# Patient Record
Sex: Female | Born: 1965 | Race: White | Hispanic: No | Marital: Single | Smoking: Never smoker
Health system: Southern US, Community
[De-identification: ages and names within clinical notes are randomized; demographics above are authoritative.]

## PROBLEM LIST (undated history)

## (undated) DIAGNOSIS — K219 Gastro-esophageal reflux disease without esophagitis: Secondary | ICD-10-CM

## (undated) DIAGNOSIS — D649 Anemia, unspecified: Secondary | ICD-10-CM

## (undated) DIAGNOSIS — E559 Vitamin D deficiency, unspecified: Secondary | ICD-10-CM

## (undated) DIAGNOSIS — I493 Ventricular premature depolarization: Secondary | ICD-10-CM

## (undated) DIAGNOSIS — G4730 Sleep apnea, unspecified: Secondary | ICD-10-CM

## (undated) HISTORY — DX: Anemia, unspecified: D64.9

## (undated) HISTORY — DX: Gastro-esophageal reflux disease without esophagitis: K21.9

## (undated) HISTORY — DX: Ventricular premature depolarization: I49.3

## (undated) HISTORY — PX: HERNIA REPAIR: SHX51

## (undated) HISTORY — DX: Vitamin D deficiency, unspecified: E55.9

## (undated) HISTORY — DX: Sleep apnea, unspecified: G47.30

## (undated) HISTORY — PX: GASTRIC BYPASS: SHX52

## (undated) MED FILL — Iron Sucrose Inj 20 MG/ML (Fe Equiv): INTRAVENOUS | Qty: 10 | Status: AC

---

## 2012-01-02 DIAGNOSIS — K219 Gastro-esophageal reflux disease without esophagitis: Secondary | ICD-10-CM | POA: Insufficient documentation

## 2012-08-02 DIAGNOSIS — G4730 Sleep apnea, unspecified: Secondary | ICD-10-CM | POA: Insufficient documentation

## 2014-10-11 DIAGNOSIS — K432 Incisional hernia without obstruction or gangrene: Secondary | ICD-10-CM | POA: Insufficient documentation

## 2015-07-13 DIAGNOSIS — I493 Ventricular premature depolarization: Secondary | ICD-10-CM | POA: Insufficient documentation

## 2017-11-20 DIAGNOSIS — Z86010 Personal history of colonic polyps: Secondary | ICD-10-CM | POA: Insufficient documentation

## 2017-11-20 DIAGNOSIS — R011 Cardiac murmur, unspecified: Secondary | ICD-10-CM | POA: Insufficient documentation

## 2019-03-24 NOTE — Progress Notes (Signed)
 Chief Complaint  Patient presents with  . Annual Exam    Subjective:  Tamara Kelly is a 53 y.o. female here for a health maintenance visit.  Patient is an established pt  Patient Active Problem List  Diagnosis  . Anemia  . H/O gastric bypass  . GERD (gastroesophageal reflux disease)  . Fatigue  . Vitamin D  insufficiency  . Colon polyps  . Sleep apnea  . Back pain  . Knee pain  . Incisional hernia  . Laxity of skin  . Frequent PVCs    Past Medical History:  Diagnosis Date  . Acid reflux   . Anemia   . Back pain 08/02/2012  . Colon polyps 07/05/2012   Dx: 2008 [place]  . Dyspnea 08/02/2012  . GERD (gastroesophageal reflux disease) 01/02/2012  . H/O gastric bypass 01/02/2012   2001 [place]  Pre surgery weight 440  . History of motion sickness   . Knee pain 08/02/2012  . Morbid obesity (CMS-HCC)   . Obesity 01/03/2012  . PONV (postoperative nausea and vomiting)   . Sleep apnea 08/02/2012   prior to bypass  . Ulcer   . Vitamin D  insufficiency 03/23/2012    Past Surgical History:  Procedure Laterality Date  . ADENOIDECTOMY    . COLONOSCOPY N/A 07/05/2015   Procedure: COLORECTAL CANCER SCREENING; COLONOSCOPY ON INDIVIDUAL NOT MEETING CRITERIA FOR HIGH RISK;  Surgeon: Ranjan Iraq, MD;  Location: [facility];  Service: General Surgery;  Laterality: N/A;  . DILATION AND CURETTAGE, DIAGNOSTIC / THERAPEUTIC    . distal gastric bypass  06/17/2013  . ESOPHAGOGASTRODOUDENOSCOPY W/BIOPSY  11/18/2012   Procedure: ZDNEYJHNHJDUMNINLIZWNDRNEB W/BIOPSY;  Surgeon: Ranjan Iraq, MD;  Location: [facility];  Service: General Surgery;;  . GASTRIC BYPASS    . GASTRIC BYPASS OPEN  2001  . HERNIA REPAIR    . REPAIR HIATAL HERNIA    . REPAIR INGUINAL HERNIA Left 02/10/2019   Procedure: Open left inguinal hernia repair;  Surgeon: Kattie Lonni Carwin, MD;  Location: [facility] OR;  Service: General Surgery;  Laterality: Left;  . TONSILLECTOMY  1972    Outpatient Medications  Prior to Visit  Medication Sig Dispense Refill  . ferrous fumarate-vitamin C (VITRON-C) 65 mg iron - 125 mg tablet Take 1 tablet by mouth 2-3 times daily      . multivitamin tablet Take 1 tablet by mouth once daily. Reported on 05/31/2015      No facility-administered medications prior to visit.     No Known Allergies  Family History  Problem Relation Age of Onset  . Breast cancer Mother 33  . High blood pressure (Hypertension) Father   . Skin cancer Father   . Aneurysm Brother   . Diabetes type II Maternal Grandfather   . Glaucoma Maternal Grandfather   . Colon cancer Neg Hx   . Ovarian cancer Neg Hx   . Uterine cancer Neg Hx     Health Habits: Dental Exam: not up to date Eye Exam: up to date Exercise: 0 times/week on average Current exercise activities: none Diet: well balanced Social History   Socioeconomic History  . Marital status: Single    Spouse name: Not on file  . Number of children: Not on file  . Years of education: Not on file  . Highest education level: Not on file  Occupational History  . Not on file  Social Needs  . Financial resource strain: Not on file  . Food insecurity    Worry: Not on file  Inability: Not on file  . Transportation needs    Medical: Not on file    Non-medical: Not on file  Tobacco Use  . Smoking status: Former Smoker    Years: 0.50    Quit date: 04/21/1988    Years since quitting: 30.9  . Smokeless tobacco: Never Used  . Tobacco comment: previous social smoker  Substance and Sexual Activity  . Alcohol use: No  . Drug use: No  . Sexual activity: Yes    Partners: Male    Birth control/protection: None  Lifestyle  . Physical activity    Days per week: Not on file    Minutes per session: Not on file  . Stress: Not on file  Relationships  . Social Musician on phone: Not on file    Gets together: Not on file    Attends religious service: Not on file    Active member of club or organization: Not on file     Attends meetings of clubs or organizations: Not on file    Relationship status: Not on file  Other Topics Concern  . Not on file  Social History Narrative  . Not on file   Social History   Substance and Sexual Activity  Alcohol Use No   Social History   Tobacco Use  Smoking Status Former Smoker  . Years: 0.50  . Quit date: 04/21/1988  . Years since quitting: 30.9  Smokeless Tobacco Never Used  Tobacco Comment   previous social smoker   Social History   Substance and Sexual Activity  Drug Use No     Health Maintenance: See under health Maintenance activity for review of completion dates as well. Immunization History  Administered Date(s) Administered  . Influenza IIV3, IM pres-free 07/14/2013  . Influenza IIV4, IM Pres-free (3 Yr+) 02/09/2019  . Influenza IIV4, IM preservative 66Yr+ 02/09/2018  . Influenza, IM unspecified 02/03/2015, 01/19/2017, 06/19/2018  . Pneumococcal Polysaccharide 23-Valent (Pneumovax-23) 02/09/2018  . Tdap 01/02/2012  . Varicella zoster (Shingrix) 02/09/2018, 04/09/2018    Health Maintenance Topics with due status: Due On     Topic Date Due   Mammogram 01/23/2019   Health Maintenance Topics with due status: Not Due     Topic Last Completion Date   Adult Tetanus (Td And Tdap) 01/02/2012   Pap Smear 12/12/2016   Colorectal Cancer Screening 11/22/2017   Annual Visit/Physical/Well Child Check 03/24/2019   Health Maintenance Topics with due status: Completed     Topic Last Completion Date   HIV Screen 12/12/2016   Shingrix 04/09/2018   Hepatitis C Screen 01/17/2019   Influenza Vaccine 02/09/2019   Health Maintenance Topics with due status: Aged Out     Topic Date Due   HPV Vaccines Aged Out    Depression Screen-PHQ2/9 PHQ-2 PHQ-2 Over the last 2 weeks, how often have you been bothered by any of the following problems? Over the last 2 weeks, have you experienced little pleasure or interest in doing things?: Not At All Feeling  down, depressed, or hopeless (or irritable for Teens only)?: Several Days Total Prescreening Score: 1  PHQ-9 (if PHQ >=3) PHQ-9 Over the last 2 weeks, how often have you been bothered by any of the following problems? Total Score =: 1  Depression Severity and Treatment Recommendations:  0-4= None  5-9= Mild / Treatment: Support, educate to call if worse; return in one month  10-14= Moderate / Treatment: Support, watchful waiting; Antidepressant or Psycotherapy  15-19= Moderately severe /  Treatment: Antidepressant OR Psychotherapy  >= 20 = Major depression, severe / Antidepressant AND Psychotherapy    Review of Systems   Review of Systems  Constitutional: Negative for fever.  HENT: Negative for hearing loss.   Eyes: Negative for blurred vision.  Respiratory: Negative for shortness of breath.   Cardiovascular: Negative for chest pain.  Gastrointestinal: Negative for constipation.  Genitourinary: Negative for hematuria.  Musculoskeletal: Positive for joint pain.       Shoulder pain intermittently  Neurological: Positive for headaches.       Chronic  Psychiatric/Behavioral: Positive for depression. The patient has insomnia.     See HPI for ROS as well.    Objective:    Vitals:   03/24/19 1004  BP: 112/76  Pulse: 96  Resp: 18  Weight: 86.2 kg (190 lb 0.6 oz)  Height: 162.6 cm (5' 4)  PainSc:   3  PainLoc: Shoulder   Body mass index is 32.62 kg/m.  Physical Exam  Constitutional: alert, cooperative and in NAD Conversation: normal and appropriate HEENT: EOMI, external ear canals normal, tympanic membranes normal and pupils equal and round Skin: normal Oropharynx: good dentition, mucous membranes moist and posterior pharynx clear Neck: supple and no thyroid enlargement or cervical adenopathy Breast: normal appearance, no masses or tenderness, No nipple retraction or dimpling, No axillary or supraclavicular adenopathy Respiratory: clear to auscultation, without  rales or wheezes Cardiovascular: regular rate and rhythm and without murmurs, rubs or gallops Abdomen: soft, nontender, nondistended and no hepatosplenomegaly Musculoskeletal: age appropriate ROM of shoulders, hips and knees; normal rom of neck. Normal rom of shoulders. No point tenderness of c spine or shoulder joints.  Extremities: no lower extremity edema, dorsalis pedis pulses normal Neurological: grossly intact and normal gait Genitalia: Exam deferred.   Assessment/Plan:   Patient was seen for a health maintenance exam.  Counseled the patient on health maintenance issues. Reviewed her health mainteance schedule and ordered appropriate tests (see orders.) Counseled on regular exercise and weight management. Recommend regular eye exams and dental cleaning.   The following issues were addressed today for health maintenance: Encouraged continued vitamin D  intake at present level Encouraged to schedule routine dental follow up Encouraged to schedule routine eye check Recommended sunscreen usage -  Follow up for annual exam in one year  Diagnoses and all orders for this visit:  Routine general medical examination at health care facility Patient was counselled on SBE monthly, Calcium and vitamin D  for bone health, Sunblock use, Regular aerobic exercise, a balanced healthy diet and Routine dental care. Health Maintenance  Topic Date Due  . Mammogram  01/23/2019  . Annual Visit/Physical/Well Child Check  03/23/2020  . Pap Smear  12/12/2021  . Adult Tetanus (Td And Tdap)  01/01/2022  . Colorectal Cancer Screening  11/23/2027  . Shingrix  Completed  . HIV Screen  Completed  . Hepatitis C Screen  Completed  . Influenza Vaccine  Completed  . HPV Vaccines  Aged Out    H/O gastric bypass -     Vitamin B12 -     Folate -     Zinc  Vitamin D  insufficiency -     25 [place] Vitamin D  History of anemia -     Complete Blood Count (CBC) with Differential -     Ferritin  Cervical  radiculopathy -     Ambulatory Referral to Physical Therapy -     gabapentin (NEURONTIN) 300 MG capsule; Take 1 capsule (300 mg total) by mouth nightly  Polyp of colon, unspecified part of colon, unspecified type -     Ambulatory Referral to Colonoscopy-due to poor prep for cscope done 2019   Return in about 1 year (around 03/23/2020).    Body mass index is 32.62 kg/m.:  Discussed the patient's BMI with patient. The BMI is above the acceptable range; discussed or provided materials on diet/exercise   Future Appointments    Date/Time Provider Department Center Visit Type   04/21/2019 12:40 PM [facility] S[facility]EN BREAST TOMO BILAT       Patient Instructions  Patient Education    Well Visit, Women 50 to 60: Care Instructions Overview   Well visits can help you stay healthy. Your doctor has checked your overall health and may have suggested ways to take good care of yourself. Your doctor also may have recommended tests. At home, you can help prevent illness with healthy eating, regular exercise, and other steps. Follow-up care is a key part of your treatment and safety. Be sure to make and go to all appointments, and call your doctor if you are having problems. It's also a good idea to know your test results and keep a list of the medicines you take. How can you care for yourself at home? Get screening tests that you and your doctor decide on. Screening helps find diseases before any symptoms appear. Eat healthy foods. Choose fruits, vegetables, whole grains, protein, and low-fat dairy foods. Limit fat, especially saturated fat. Reduce salt in your diet. Limit alcohol. Have no more than 1 drink a day or 7 drinks a week. Get at least 30 minutes of exercise on most days of the week. Walking is a good choice. You also may want to do other activities, such as running, swimming, cycling, or playing tennis or team sports. Reach and stay at a  healthy weight. This will lower your risk for many problems, such as obesity, diabetes, heart disease, and high blood pressure. Do not smoke. Smoking can make health problems worse. If you need help quitting, talk to your doctor about stop-smoking programs and medicines. These can increase your chances of quitting for good. Care for your mental health. It is easy to get weighed down by worry and stress. Learn strategies to manage stress, like deep breathing and mindfulness, and stay connected with your family and community. If you find you often feel sad or hopeless, talk with your doctor. Treatment can help. Talk to your doctor about whether you have any risk factors for sexually transmitted infections (STIs). You can help prevent STIs if you wait to have sex with a new partner (or partners) until you've each been tested for STIs. It also helps if you use condoms (female or female condoms) and if you limit your sex partners to one person who only has sex with you. Vaccines are available for some STIs. If you think you may have a problem with alcohol or drug use, talk to your doctor. This includes prescription medicines (such as amphetamines and opioids) and illegal drugs (such as cocaine and methamphetamine). Your doctor can help you figure out what type of treatment is best for you. Protect your skin from too much sun. When you're outdoors from 10 a.m. to 4 p.m., stay in the shade or cover up with clothing and a hat with a wide brim. Wear sunglasses that block UV rays. Even when it's cloudy, put broad-spectrum sunscreen (SPF 30 or higher) on any exposed skin. See  a dentist one or two times a year for checkups and to have your teeth cleaned. Wear a seat belt in the car. When should you call for help? Watch closely for changes in your health, and be sure to contact your doctor if you have any problems or symptoms that concern you. Where can you learn more? Log in to your [facility] MyChart account at  [email or web address] and click on top menu option Health then select Search Medical Library. Enter [phone] in the search box and click the magnify glass to learn more about Well Visit, Women 50 to 65: Care Instructions. Current as of: May 27, 2020Content Version: 12.6  2006-2020 [employer].  Care instructions adapted under license by your healthcare professional. If you have questions about a medical condition or this instruction, always ask your healthcare professional. [employer], Inc[employer] disclaims any warranty or liability for your use of this information.

## 2019-05-18 ENCOUNTER — Other Ambulatory Visit: Payer: Self-pay

## 2019-05-18 ENCOUNTER — Ambulatory Visit
Admission: EM | Admit: 2019-05-18 | Discharge: 2019-05-18 | Disposition: A | Discharge status: Home / Self Care | Payer: 59 | Attending: Family Medicine | Admitting: Family Medicine

## 2019-05-18 ENCOUNTER — Encounter: Payer: Self-pay | Admitting: Emergency Medicine

## 2019-05-18 DIAGNOSIS — B349 Viral infection, unspecified: Secondary | ICD-10-CM | POA: Insufficient documentation

## 2019-05-18 DIAGNOSIS — M791 Myalgia, unspecified site: Secondary | ICD-10-CM

## 2019-05-18 DIAGNOSIS — Z9884 Bariatric surgery status: Secondary | ICD-10-CM | POA: Diagnosis not present

## 2019-05-18 DIAGNOSIS — Z20822 Contact with and (suspected) exposure to covid-19: Secondary | ICD-10-CM | POA: Diagnosis not present

## 2019-05-18 DIAGNOSIS — R5383 Other fatigue: Secondary | ICD-10-CM | POA: Diagnosis not present

## 2019-05-18 DIAGNOSIS — R0981 Nasal congestion: Secondary | ICD-10-CM

## 2019-05-18 DIAGNOSIS — R519 Headache, unspecified: Secondary | ICD-10-CM

## 2019-05-18 NOTE — ED Provider Notes (Signed)
[facility]    CSN: [id number] Arrival date & time: 05/18/19  1657      History   Chief Complaint Chief Complaint  Patient presents with  . Generalized Body Aches  . Nasal Congestion  . Headache  . Fatigue    HPI Tamara Kelly is a 54 y.o. female.   54 yo female with a c/o body aches, nasal congestion, headaches, and fatigue for the past 2 days. Denies any fevers, chills, cough, shortness of breath or other symptoms. No known sick contacts.    Headache   History reviewed. No pertinent past medical history.  There are no problems to display for this patient.   Past Surgical History:  Procedure Laterality Date  . GASTRIC BYPASS    . HERNIA REPAIR      OB History   No obstetric history on file.      Home Medications    Prior to Admission medications   Not on File    Family History History reviewed. No pertinent family history.  Social History Social History   Tobacco Use  . Smoking status: Never Smoker  . Smokeless tobacco: Never Used  Substance Use Topics  . Alcohol use: Yes  . Drug use: Never     Allergies   Patient has no known allergies.   Review of Systems Review of Systems  Neurological: Positive for headaches.     Physical Exam Triage Vital Signs ED Triage Vitals  Enc Vitals Group     BP 05/18/19 1716 100/67     Pulse Rate 05/18/19 1716 76     Resp 05/18/19 1716 18     Temp 05/18/19 1716 98.7 F (37.1 C)     Temp src --      SpO2 05/18/19 1716 97 %     Weight 05/18/19 1714 191 lb (86.6 kg)     Height 05/18/19 1714 5\' 6"  (1.676 m)     Head Circumference --      Peak Flow --      Pain Score 05/18/19 1713 7     Pain Loc --      Pain Edu? --      Excl. in [place]? --    No data found.  Updated Vital Signs BP 100/67 (BP Location: Right Arm)   Pulse 76   Temp 98.7 F (37.1 C)   Resp 18   Ht 5\' 6"  (1.676 m)   Wt 86.6 kg   SpO2 97%   BMI 30.83 kg/m   Visual Acuity Right Eye Distance:   Left Eye  Distance:   Bilateral Distance:    Right Eye Near:   Left Eye Near:    Bilateral Near:     Physical Exam Vitals and nursing note reviewed.  Constitutional:      General: She is not in acute distress.    Appearance: Normal appearance. She is not toxic-appearing or diaphoretic.  Eyes:     Conjunctiva/sclera: Conjunctivae normal.  Cardiovascular:     Rate and Rhythm: Normal rate.     Heart sounds: Normal heart sounds.  Pulmonary:     Effort: Pulmonary effort is normal. No respiratory distress.     Breath sounds: Normal breath sounds.  Neurological:     Mental Status: She is alert.      UC Treatments / Results  Labs (all labs ordered are listed, but only abnormal results are displayed) Labs Reviewed  NOVEL CORONAVIRUS, NAA (HOSP ORDER, SEND-OUT TO REF LAB; TAT 18-24 HRS)  EKG   Radiology No results found.  Procedures Procedures (including critical care time)  Medications Ordered in UC Medications - No data to display  Initial Impression / Assessment and Plan / UC Course  I have reviewed the triage vital signs and the nursing notes.  Pertinent labs & imaging results that were available during my care of the patient were reviewed by me and considered in my medical decision making (see chart for details).      Final Clinical Impressions(s) / UC Diagnoses   Final diagnoses:  Viral syndrome     Discharge Instructions     Rest, fluids, over the counter medications as needed Await covid test    ED Prescriptions    None      1. diagnosis reviewed with patient  2. Recommend supportive treatment as above 3. Await send out covid test 4. Follow-up prn if symptoms worsen or don't improve  PDMP not reviewed this encounter.   Payton Mccallum, MD 05/18/19 1815

## 2019-05-18 NOTE — Discharge Instructions (Signed)
Rest, fluids, over the counter medications as needed °Await covid test °

## 2019-05-18 NOTE — ED Triage Notes (Signed)
Patient c/o body aches, nasal congestion, headache and fatigue that started last night. Denies cough and fever.

## 2019-05-19 LAB — NOVEL CORONAVIRUS, NAA (HOSP ORDER, SEND-OUT TO REF LAB; TAT 18-24 HRS): SARS-CoV-2, NAA: NOT DETECTED

## 2019-05-20 ENCOUNTER — Telehealth: Payer: Self-pay | Admitting: Emergency Medicine

## 2019-05-20 NOTE — Telephone Encounter (Signed)
Patient called wanting her COVID results.  Patient's name and DOB were verified.  Patient was notified that her COVID result was not detected/ negative.  Patient verbalized understanding.

## 2019-09-22 ENCOUNTER — Encounter: Payer: Self-pay | Admitting: Emergency Medicine

## 2019-09-22 ENCOUNTER — Other Ambulatory Visit: Payer: Self-pay

## 2019-09-22 ENCOUNTER — Ambulatory Visit
Admission: EM | Admit: 2019-09-22 | Discharge: 2019-09-22 | Disposition: A | Discharge status: Home / Self Care | Payer: 59

## 2019-09-22 DIAGNOSIS — J0191 Acute recurrent sinusitis, unspecified: Secondary | ICD-10-CM

## 2019-09-22 DIAGNOSIS — R197 Diarrhea, unspecified: Secondary | ICD-10-CM

## 2019-09-22 DIAGNOSIS — R531 Weakness: Secondary | ICD-10-CM

## 2019-09-22 MED ORDER — AMOXICILLIN-POT CLAVULANATE 875-125 MG PO TABS: 1.0000 | ORAL_TABLET | Freq: Two times a day (BID) | ORAL | 0 refills | Status: AC

## 2019-09-22 MED ORDER — ONDANSETRON 4 MG PO TBDP: 4.0000 mg | ORAL_TABLET | Freq: Three times a day (TID) | ORAL | 0 refills | Status: DC | PRN

## 2019-09-22 NOTE — Discharge Instructions (Signed)
It was very nice seeing you today in clinic. Thank you for entrusting me with your care.   Rest and Stay HYDRATED. Water and electrolyte containing beverages (Gatorade, Pedialyte) are best to prevent dehydration and electrolyte abnormalities. May use Tylenol and/or Ibuprofen as needed for pain/fever.   Make arrangements to follow up with your regular doctor in 1 week for re-evaluation if not improving. * If your symptoms/condition worsens, please seek follow up care either here or in the ER. Please remember, our [place] providers are "right here with you" when you need us.   Again, it was my pleasure to take care of you today. Thank you for choosing our clinic. I hope that you start to feel better quickly.   Winfred Iiams, MSN, APRN, FNP-C, CEN Advanced Practice Provider [facility] 

## 2019-09-22 NOTE — ED Triage Notes (Addendum)
Patient in today c/o sinus problems x 4 days. Fatigue and body aches x 2 days. Patient states she has felt feverish, but hasn't taken her temperature. Patient has had both Moderna covid vaccines the last injection 08/18/19. Patient states her boyfriend and her boyfriend's son has been sick. Patient took OTC Tylenol yesterday.

## 2019-09-23 NOTE — ED Provider Notes (Signed)
[place], [place]   Name: Tamara Kelly DOB: 1965/07/13 MRN: [id number] CSN: [id number] PCP: Dondra Prader, MD  Arrival date and time:  09/22/19 1142  Chief Complaint:  Sinus Problem, Fatigue, and Generalized Body Aches  NOTE: Prior to seeing the patient today, I have reviewed the triage nursing documentation and vital signs. Clinical staff has updated patient's PMH/PSHx, current medication list, and drug allergies/intolerances to ensure comprehensive history available to assist in medical decision making.   History:   HPI: Tamara Kelly is a 54 y.o. female who presents today with complaints of cough, congestion, headache, paranasal sinus tenderness, diffuse myalgias, and generalized weakness that began "over the weekend" (4-5 days ago). Patient reports subjective fevers; unsure of Tmax. Cough has been non-productive with no associated shortness of breath or wheezing. Patient has had nausea, vomiting, and diarrhea. She has not appreciated any blood in her emesis or stools. Appetite has been poor overall, however patient has been able to consume adequate amounts of oral fluids. She denies any perceived alterations to her sense of taste and smell. Patient reporting exposure to multiple sick individuals; reporting that her SO and his son currently have URI symptoms. She has not been tested for SARS-CoV-2 (novel coronavirus) in the past 14 days; unsure of the date of her last negative test. Patient has been fully vaccinated for SARS-CoV-2 (novel coronavirus); she received her second Moderna vaccine on 08/18/2019. She does not wish to be tested for SARS-CoV-2 today. In efforts to conservatively manage her symptoms at home, the patient notes that she has used APAP, which has not helped to improve her symptoms.   History reviewed. No pertinent past medical history.  Past Surgical History:  Procedure Laterality Date  . GASTRIC BYPASS    . HERNIA REPAIR      Family History  Problem Relation  Age of Onset  . Breast cancer Mother   . Hypertension Father   . Thyroid disease Father     Social History   Tobacco Use  . Smoking status: Never Smoker  . Smokeless tobacco: Never Used  Substance Use Topics  . Alcohol use: Yes    Comment: occassional  . Drug use: Never    There are no problems to display for this patient.   Home Medications:    Current Meds  Medication Sig  . acetaminophen (TYLENOL) 325 MG tablet Take by mouth.  . Multiple Vitamin (MULTI-VITAMIN) tablet Take by mouth.    Allergies:   Patient has no known allergies.  Review of Systems (ROS):  Review of systems NEGATIVE unless otherwise noted in narrative H&P section.   Vital Signs: Today's Vitals   09/22/19 1213 09/22/19 1214 09/22/19 1239  BP: 100/64    Pulse: 60    Resp: 18    Temp: 98 F (36.7 C)    TempSrc: Oral    SpO2: 99%    Weight:  194 lb (88 kg)   Height:  5\' 5"  (1.651 m)   PainSc:  7  7     Physical Exam: Physical Exam  Constitutional: She is oriented to person, place, and time and well-developed, well-nourished, and in no distress.  Acutely ill appearing; fatigued.   HENT:  Head: Normocephalic and atraumatic.  Right Ear: Tympanic membrane normal.  Left Ear: Tympanic membrane normal.  Nose: Mucosal edema, rhinorrhea and sinus tenderness present.  Mouth/Throat: Uvula is midline and mucous membranes are normal. Posterior oropharyngeal erythema present. No oropharyngeal exudate or posterior oropharyngeal edema.  Eyes: Pupils are equal, round,  and reactive to light.  Cardiovascular: Normal rate, regular rhythm, normal heart sounds and intact distal pulses.  Pulmonary/Chest: Effort normal and breath sounds normal.  Mild cough noted in clinic. No SOB or increased WOB. No distress. Able to speak in complete sentences without difficulties. SPO2 99% on RA.  Abdominal: Soft. Normal appearance and bowel sounds are normal. She exhibits no distension. There is no abdominal tenderness.    Neurological: She is alert and oriented to person, place, and time. Gait normal.  Skin: Skin is warm and dry. No rash noted. She is not diaphoretic.  Psychiatric: Mood, memory, affect and judgment normal.  Nursing note and vitals reviewed.   Urgent Care Treatments / Results:   No orders of the defined types were placed in this encounter.   LABS: PLEASE NOTE: all labs that were ordered this encounter are listed, however only abnormal results are displayed. Labs Reviewed - No data to display  EKG: -None  RADIOLOGY: No results found.  PROCEDURES: Procedures  MEDICATIONS RECEIVED THIS VISIT: Medications - No data to display  PERTINENT CLINICAL COURSE NOTES/UPDATES:   Initial Impression / Assessment and Plan / Urgent Care Course:  Pertinent labs & imaging results that were available during my care of the patient were personally reviewed by me and considered in my medical decision making (see lab/imaging section of note for values and interpretations).  [name] is a 54 y.o. female who presents to [facility] today with complaints of Sinus Problem, Fatigue, and Generalized Body Aches  Patient acutely ill appearing (non-toxic) appearing in clinic today. She does not appear to be in any acute distress. Presenting symptoms (see HPI) and exam as documented above. Symptoms present x 5 days. Has been vaccinated for SARS-CoV-2; declines offered testing. Exam consistent with acute sinusitis. Treating with a 7 day course of amoxicillin-clavulanate. Reviewed supportive care measures; rest, increased hydration, and PRN use of APAP and/or IBU for discomfort/fever. Sending prescription for a PRN supply on ODT ondansetron tablets for patient to use for nausea.   Current clinical condition warrants patient being out of work in order to recover from her current injury/illness. She was provided with the appropriate documentation to provide to her place of employment that will allow for  her to RTW on 09/26/2019 with no restrictions.   Discussed follow up with primary care physician in 1 week for re-evaluation. I have reviewed the follow up and strict return precautions for any new or worsening symptoms. Patient is aware of symptoms that would be deemed urgent/emergent, and would thus require further evaluation either here or in the emergency department. At the time of discharge, she verbalized understanding and consent with the discharge plan as it was reviewed with her. All questions were fielded by provider and/or clinic staff prior to patient discharge.    Final Clinical Impressions / Urgent Care Diagnoses:   Final diagnoses:  Acute recurrent sinusitis, unspecified location  Nausea vomiting and diarrhea  Generalized weakness    New Prescriptions:  [place] Controlled Substance Registry consulted? Not Applicable  Meds ordered this encounter  Medications  . amoxicillin-clavulanate (AUGMENTIN) 875-125 MG tablet    Sig: Take 1 tablet by mouth 2 (two) times daily for 7 days.    Dispense:  14 tablet    Refill:  0  . ondansetron (ZOFRAN-ODT) 4 MG disintegrating tablet    Sig: Take 1 tablet (4 mg total) by mouth every 8 (eight) hours as needed.    Dispense:  15 tablet    Refill:  0    Recommended Follow up Care:  Patient encouraged to follow up with the following provider within the specified time frame, or sooner as dictated by the severity of her symptoms. As always, she was instructed that for any urgent/emergent care needs, she should seek care either here or in the emergency department for more immediate evaluation.  Follow-up Information    Alvia Grove, MD In 1 week.   Specialty: Family Medicine Why: General reassessment of symptoms if not improving Contact information: [address] [place] [address] [phone]         NOTE: This note was prepared using Dragon dictation software along with smaller phrase technology. Despite my best  ability to proofread, there is the potential that transcriptional errors may still occur from this process, and are completely unintentional.    Karen Kitchens, NP 09/23/19 1645

## 2020-08-19 DIAGNOSIS — U071 COVID-19: Secondary | ICD-10-CM

## 2020-08-19 HISTORY — DX: COVID-19: U07.1

## 2020-09-07 ENCOUNTER — Ambulatory Visit
Admission: EM | Admit: 2020-09-07 | Discharge: 2020-09-07 | Disposition: A | Discharge status: Home / Self Care | Payer: BC Managed Care – PPO | Attending: Family Medicine | Admitting: Family Medicine

## 2020-09-07 ENCOUNTER — Other Ambulatory Visit: Payer: Self-pay

## 2020-09-07 ENCOUNTER — Ambulatory Visit (INDEPENDENT_AMBULATORY_CARE_PROVIDER_SITE_OTHER): Payer: BC Managed Care – PPO

## 2020-09-07 DIAGNOSIS — U099 Post covid-19 condition, unspecified: Secondary | ICD-10-CM

## 2020-09-07 DIAGNOSIS — R059 Cough, unspecified: Secondary | ICD-10-CM | POA: Diagnosis not present

## 2020-09-07 DIAGNOSIS — R0602 Shortness of breath: Secondary | ICD-10-CM

## 2020-09-07 MED ORDER — PROMETHAZINE-DM 6.25-15 MG/5ML PO SYRP: 5.0000 mL | ORAL_SOLUTION | Freq: Four times a day (QID) | ORAL | 0 refills | Status: DC | PRN

## 2020-09-07 MED ORDER — ALBUTEROL SULFATE HFA 108 (90 BASE) MCG/ACT IN AERS: 1.0000 | INHALATION_SPRAY | Freq: Four times a day (QID) | RESPIRATORY_TRACT | 0 refills | Status: DC | PRN

## 2020-09-07 NOTE — Discharge Instructions (Signed)
Chest xray negative.  Medication as prescribed.  Take care  Dr. Adriana Simas

## 2020-09-07 NOTE — ED Triage Notes (Addendum)
Pt c/o body aches, chills, cough, nasal congestion that have been present since last Wednesday, most have improved. Pt reports continued cough and shob. Pt took an at-home COVID test last Thursday and it was positive. Pt reports she had been on a trip out of state and when she got home she was sick.

## 2020-09-07 NOTE — ED Provider Notes (Signed)
[facility]    CSN: [id number] Arrival date & time: 09/07/20  1357      History   Chief Complaint Chief Complaint  Patient presents with  . Cough  . Shortness of Breath   HPI  55 year old female presents with the above complaints.  Patient reports that she developed symptoms last Wednesday.  She reports body aches, chills, cough, congestion.  Tested positive for COVID-19 on Thursday.  Patient reports that her symptoms have improved but she continues to have cough and shortness of breath.  Shortness of breath is particular worse with exertion.  No recent fever.  She has not taken treatment for COVID-19.  No other associated symptoms.  No other complaints.  Past Medical History:  Diagnosis Date  . COVID-19 08/2020   Past Surgical History:  Procedure Laterality Date  . GASTRIC BYPASS    . HERNIA REPAIR      OB History   No obstetric history on file.      Home Medications    Prior to Admission medications   Medication Sig Start Date End Date Taking? Authorizing Provider  albuterol (VENTOLIN HFA) 108 (90 Base) MCG/ACT inhaler Inhale 1-2 puffs into the lungs every 6 (six) hours as needed for wheezing or shortness of breath. 09/07/20  Yes Asja Frommer G, DO  promethazine-dextromethorphan (PROMETHAZINE-DM) 6.25-15 MG/5ML syrup Take 5 mLs by mouth 4 (four) times daily as needed for cough. 09/07/20  Yes Ebelyn Bohnet G, DO  acetaminophen (TYLENOL) 325 MG tablet Take by mouth. 11/22/17   [provider]  Multiple Vitamin (MULTI-VITAMIN) tablet Take by mouth.    [provider]    Family History Family History  Problem Relation Age of Onset  . Breast cancer Mother   . Hypertension Father   . Thyroid disease Father     Social History Social History   Tobacco Use  . Smoking status: Never Smoker  . Smokeless tobacco: Never Used  Vaping Use  . Vaping Use: Never used  Substance Use Topics  . Alcohol use: Yes    Comment: occassional  . Drug use:  Never     Allergies   Patient has no known allergies.   Review of Systems Review of Systems  Constitutional: Negative for fever.  Respiratory: Positive for cough and shortness of breath.    Physical Exam Triage Vital Signs ED Triage Vitals  Enc Vitals Group     BP 09/07/20 1412 107/73     Pulse Rate 09/07/20 1412 62     Resp 09/07/20 1412 18     Temp 09/07/20 1412 98.6 F (37 C)     Temp Source 09/07/20 1412 Oral     SpO2 09/07/20 1412 100 %     Weight 09/07/20 1410 203 lb (92.1 kg)     Height 09/07/20 1410 5\' 6"  (1.676 m)     Head Circumference --      Peak Flow --      Pain Score 09/07/20 1410 0     Pain Loc --      Pain Edu? --      Excl. in GC? --    Updated Vital Signs BP 107/73 (BP Location: Left Arm)   Pulse 62   Temp 98.6 F (37 C) (Oral)   Resp 18   Ht 5\' 6"  (1.676 m)   Wt 92.1 kg   SpO2 100%   BMI 32.77 kg/m   Visual Acuity Right Eye Distance:   Left Eye Distance:   Bilateral  Distance:    Right Eye Near:   Left Eye Near:    Bilateral Near:     Physical Exam Vitals and nursing note reviewed.  Constitutional:      General: She is not in acute distress.    Appearance: Normal appearance. She is not ill-appearing.  HENT:     Head: Normocephalic and atraumatic.  Eyes:     General:        Right eye: No discharge.        Left eye: No discharge.     Conjunctiva/sclera: Conjunctivae normal.  Cardiovascular:     Rate and Rhythm: Normal rate and regular rhythm.     Heart sounds: No murmur heard.   Pulmonary:     Effort: Pulmonary effort is normal.     Breath sounds: Normal breath sounds. No wheezing, rhonchi or rales.  Neurological:     Mental Status: She is alert.  Psychiatric:        Mood and Affect: Mood normal.        Behavior: Behavior normal.    UC Treatments / Results  Labs (all labs ordered are listed, but only abnormal results are displayed) Labs Reviewed - No data to display  EKG   Radiology DG Chest 2 View  Result  Date: 09/07/2020 CLINICAL DATA:  Cough, shortness of breath, chills.  Congestion. EXAM: CHEST - 2 VIEW COMPARISON:  None. FINDINGS: Heart and mediastinal shadows are normal. The lungs are clear. No infiltrate, collapse or effusion. No significant bone finding. IMPRESSION: No active cardiopulmonary disease. Electronically Signed   By: Paulina Fusi M.D.   On: 09/07/2020 15:05    Procedures Procedures (including critical care time)  Medications Ordered in UC Medications - No data to display  Initial Impression / Assessment and Plan / UC Course  I have reviewed the triage vital signs and the nursing notes.  Pertinent labs & imaging results that were available during my care of the patient were reviewed by me and considered in my medical decision making (see chart for details).    55 year old female presents with cough and shortness of breath status post COVID-19.  Chest x-ray was obtained and was independently reviewed by me.  Interpretation: Normal chest x-ray.  No evidence of pneumonia.  Treating with albuterol and Promethazine DM.  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  Medication as prescribed.  Take care  Dr. Adriana Simas    ED Prescriptions    Medication Sig Dispense Auth. Provider   promethazine-dextromethorphan (PROMETHAZINE-DM) 6.25-15 MG/5ML syrup Take 5 mLs by mouth 4 (four) times daily as needed for cough. 118 mL Savanah Bayles G, DO   albuterol (VENTOLIN HFA) 108 (90 Base) MCG/ACT inhaler Inhale 1-2 puffs into the lungs every 6 (six) hours as needed for wheezing or shortness of breath. 18 g Tommie Sams, DO     PDMP not reviewed this encounter.   Tommie Sams, [place] 09/07/20 [phone]

## 2020-09-29 ENCOUNTER — Other Ambulatory Visit: Payer: Self-pay | Admitting: Family Medicine

## 2021-07-15 ENCOUNTER — Other Ambulatory Visit
Admission: RE | Admit: 2021-07-15 | Discharge: 2021-07-15 | Disposition: A | Discharge status: Home / Self Care | Payer: BC Managed Care – PPO | Attending: Ophthalmology | Admitting: Ophthalmology

## 2021-07-15 DIAGNOSIS — K912 Postsurgical malabsorption, not elsewhere classified: Secondary | ICD-10-CM | POA: Insufficient documentation

## 2021-07-15 DIAGNOSIS — H5360 Unspecified night blindness: Secondary | ICD-10-CM | POA: Diagnosis not present

## 2021-07-15 DIAGNOSIS — K9589 Other complications of other bariatric procedure: Secondary | ICD-10-CM | POA: Insufficient documentation

## 2021-07-15 DIAGNOSIS — Y838 Other surgical procedures as the cause of abnormal reaction of the patient, or of later complication, without mention of misadventure at the time of the procedure: Secondary | ICD-10-CM | POA: Diagnosis not present

## 2021-07-15 LAB — IRON AND TIBC
Iron: 31 ug/dL (ref 28–170)
Saturation Ratios: 7 % — ABNORMAL LOW (ref 10.4–31.8)
TIBC: 423 ug/dL (ref 250–450)
UIBC: 392 ug/dL

## 2021-07-15 LAB — CBC WITH DIFFERENTIAL/PLATELET
Abs Immature Granulocytes: 0.01 10*3/uL (ref 0.00–0.07)
Basophils Absolute: 0 10*3/uL (ref 0.0–0.1)
Basophils Relative: 0 %
Eosinophils Absolute: 0.1 10*3/uL (ref 0.0–0.5)
Eosinophils Relative: 1 %
HCT: 28.8 % — ABNORMAL LOW (ref 36.0–46.0)
Hemoglobin: 8.4 g/dL — ABNORMAL LOW (ref 12.0–15.0)
Immature Granulocytes: 0 %
Lymphocytes Relative: 27 %
Lymphs Abs: 1.4 10*3/uL (ref 0.7–4.0)
MCH: 24.1 pg — ABNORMAL LOW (ref 26.0–34.0)
MCHC: 29.2 g/dL — ABNORMAL LOW (ref 30.0–36.0)
MCV: 82.5 fL (ref 80.0–100.0)
Monocytes Absolute: 0.5 10*3/uL (ref 0.1–1.0)
Monocytes Relative: 9 %
Neutro Abs: 3.2 10*3/uL (ref 1.7–7.7)
Neutrophils Relative %: 63 %
Platelets: 164 10*3/uL (ref 150–400)
RBC: 3.49 MIL/uL — ABNORMAL LOW (ref 3.87–5.11)
RDW: 18.7 % — ABNORMAL HIGH (ref 11.5–15.5)
WBC: 5.1 10*3/uL (ref 4.0–10.5)
nRBC: 0 % (ref 0.0–0.2)

## 2021-07-15 LAB — COMPREHENSIVE METABOLIC PANEL
ALT: 16 U/L (ref 0–44)
AST: 16 U/L (ref 15–41)
Albumin: 4 g/dL (ref 3.5–5.0)
Alkaline Phosphatase: 62 U/L (ref 38–126)
Anion gap: 8 (ref 5–15)
BUN: 13 mg/dL (ref 6–20)
CO2: 25 mmol/L (ref 22–32)
Calcium: 8.7 mg/dL — ABNORMAL LOW (ref 8.9–10.3)
Chloride: 106 mmol/L (ref 98–111)
Creatinine, Ser: 0.88 mg/dL (ref 0.44–1.00)
GFR, Estimated: >60 mL/min (ref >60)
Glucose, Bld: 101 mg/dL — ABNORMAL HIGH (ref 70–99)
Potassium: 3.8 mmol/L (ref 3.5–5.1)
Sodium: 139 mmol/L (ref 135–145)
Total Bilirubin: 1.2 mg/dL (ref 0.3–1.2)
Total Protein: 7 g/dL (ref 6.5–8.1)

## 2021-07-15 LAB — VITAMIN D 25 HYDROXY (VIT D DEFICIENCY, FRACTURES): Vit D, 25-Hydroxy: 9.05 ng/mL — ABNORMAL LOW (ref 30–100)

## 2021-07-20 LAB — VITAMIN K1, SERUM: VITAMIN K1: <0.1 ng/mL — ABNORMAL LOW (ref 0.10–2.20)

## 2021-07-25 LAB — VITAMIN A: Vitamin A (Retinoic Acid): <2.5 ug/dL — ABNORMAL LOW (ref 20.1–62.0)

## 2021-07-25 LAB — VITAMIN E
Vitamin E (Alpha Tocopherol): 4.6 mg/L — ABNORMAL LOW (ref 7.0–25.1)
Vitamin E(Gamma Tocopherol): 0.2 mg/L — ABNORMAL LOW (ref 0.5–5.5)

## 2021-08-27 ENCOUNTER — Ambulatory Visit (INDEPENDENT_AMBULATORY_CARE_PROVIDER_SITE_OTHER): Payer: BC Managed Care – PPO | Admitting: Internal Medicine

## 2021-08-27 ENCOUNTER — Encounter: Payer: Self-pay | Admitting: Internal Medicine

## 2021-08-27 VITALS — BP 103/64 | HR 84 | Ht 66.0 in | Wt 214.8 lb

## 2021-08-27 DIAGNOSIS — Z9884 Bariatric surgery status: Secondary | ICD-10-CM

## 2021-08-27 DIAGNOSIS — Z1211 Encounter for screening for malignant neoplasm of colon: Secondary | ICD-10-CM

## 2021-08-27 DIAGNOSIS — Z30431 Encounter for routine checking of intrauterine contraceptive device: Secondary | ICD-10-CM

## 2021-08-27 DIAGNOSIS — H547 Unspecified visual loss: Secondary | ICD-10-CM | POA: Insufficient documentation

## 2021-08-27 DIAGNOSIS — E559 Vitamin D deficiency, unspecified: Secondary | ICD-10-CM

## 2021-08-27 DIAGNOSIS — D508 Other iron deficiency anemias: Secondary | ICD-10-CM

## 2021-08-27 HISTORY — DX: Other iron deficiency anemias: D50.8

## 2021-08-27 NOTE — Progress Notes (Signed)
? ?New Patient Office Visit ? ?Subjective:  ?Patient ID: Tamara Kelly, female    DOB: 05/19/1965  Age: 56 y.o. MRN: [id number] ? ?CC:  ?Chief Complaint  ?Patient presents with  ? New Patient (Initial Visit)  ? ? ?HPI ?Patient presents for decrease in the right side, she has a history of stomach bypass surgery recently.  Started losing her mind.  She has lab test done which shows anemia, vitamin D deficiency vitamin E deficiency, she had a history of colonoscopy but it was not complete because of irregular heartbeat.  I will refer her to GI specialist, hematologist, and gynecologist because she has an IUD.  She has not taken any of her vitamins after her gastric bypass surgery ? ?Past Medical History:  ?Diagnosis Date  ? COVID-19 08/2020  ? ? ? ?Current Outpatient Medications:  ?  acetaminophen (TYLENOL) 325 MG tablet, Take by mouth., Disp: , Rfl:  ?  Multiple Vitamin (MULTI-VITAMIN) tablet, Take by mouth., Disp: , Rfl:   ? ?Past Surgical History:  ?Procedure Laterality Date  ? GASTRIC BYPASS    ? HERNIA REPAIR    ? ? ?Family History  ?Problem Relation Age of Onset  ? Breast cancer Mother   ? Hypertension Father   ? Thyroid disease Father   ? ? ?Social History  ? ?Socioeconomic History  ? Marital status: Single  ?  Spouse name: Not on file  ? Number of children: Not on file  ? Years of education: Not on file  ? Highest education level: Not on file  ?Occupational History  ? Not on file  ?Tobacco Use  ? Smoking status: Never  ? Smokeless tobacco: Never  ?Vaping Use  ? Vaping Use: Never used  ?Substance and Sexual Activity  ? Alcohol use: Yes  ?  Comment: occassional  ? Drug use: Never  ? Sexual activity: Not Currently  ?Other Topics Concern  ? Not on file  ?Social History Narrative  ? Not on file  ? ?Social Determinants of Health  ? ?Financial Resource Strain: Not on file  ?Food Insecurity: Not on file  ?Transportation Needs: Not on file  ?Physical Activity: Not on file  ?Stress: Not on file  ?Social Connections: Not  on file  ?Intimate Partner Violence: Not on file  ? ? ?ROS ?Review of Systems  ?Constitutional:  Negative for chills.  ?HENT:  Negative for congestion.   ?Eyes:  Positive for visual disturbance.  ?Respiratory:  Negative for cough.   ?Genitourinary:  Negative for dysuria.  ?Neurological:  Negative for speech difficulty.  ? ?Objective:  ? ?Today's Vitals: BP 103/64   Pulse 84   Ht 5\' 6"  (1.676 m)   Wt 214 lb 12.8 oz (97.4 kg)   BMI 34.67 kg/m?  ? ?Physical Exam ?Constitutional:   ?   Appearance: She is obese.  ?HENT:  ?   Head: Normocephalic.  ?   Nose: Nose normal.  ?   Mouth/Throat:  ?   Mouth: Mucous membranes are moist.  ?Cardiovascular:  ?   Rate and Rhythm: Normal rate.  ?Pulmonary:  ?   Effort: Pulmonary effort is normal.  ?Abdominal:  ?   Palpations: Abdomen is soft.  ? ? ?   Comments: Stomach  bypass surgery  ?Musculoskeletal:  ?   Cervical back: Normal range of motion.  ?Neurological:  ?   Mental Status: She is alert.  ? ? ?Assessment & Plan:  ? ?Problem List Items Addressed This Visit   ? ?  ?  Other  ? Vitamin D deficiency  ?  Take 2000 units of vitamin D every day ? ?  ?  ? Iron deficiency anemia secondary to inadequate dietary iron intake - Primary  ?  She has to take her vitamin and iron ? ?, Will refer her to GI to consider colonoscopy and endoscopy ? ?  ?  ? Gastric bypass status for obesity  ?  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 ?  ?  ? Visual loss  ?  Refer to the eye doctor.  Patient has to take vitamin D at least 2000 units every day and vitamin A as directed by the eye doctor, we will also refer her to hematologist ? ?  ?  ? ? ?Outpatient Encounter Medications as of 08/27/2021  ?Medication Sig  ? acetaminophen (TYLENOL) 325 MG tablet Take by mouth.  ? Multiple Vitamin (MULTI-VITAMIN) tablet Take by mouth.  ? [DISCONTINUED] albuterol (VENTOLIN HFA) 108 (90 Base) MCG/ACT inhaler Inhale 1-2 puffs into the lungs every 6 (six)  hours as needed for wheezing or shortness of breath. (Patient not taking: Reported on 08/27/2021)  ? [DISCONTINUED] promethazine-dextromethorphan (PROMETHAZINE-DM) 6.25-15 MG/5ML syrup Take 5 mLs by mouth 4 (four) times daily as needed for cough. (Patient not taking: Reported on 08/27/2021)  ? ?No facility-administered encounter medications on file as of 08/27/2021.  ? ? ?Follow-up: No follow-ups on file.  ? ?Corky Downs, MD ?

## 2021-08-27 NOTE — Assessment & Plan Note (Signed)
Refer to the eye doctor.  Patient has to take vitamin D at least 2000 units every day and vitamin A as directed by the eye doctor, we will also refer her to hematologist ?

## 2021-08-27 NOTE — Assessment & Plan Note (Signed)
She has to take her vitamin and iron ? ?, Will refer her to GI to consider colonoscopy and endoscopy ?

## 2021-08-27 NOTE — Assessment & Plan Note (Signed)
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

## 2021-08-27 NOTE — Assessment & Plan Note (Signed)
Take 2000 units of vitamin D every day ?

## 2021-08-28 ENCOUNTER — Other Ambulatory Visit: Payer: Self-pay

## 2021-08-28 NOTE — Progress Notes (Signed)
Patient having bloating and constipation so scheduled in person visit  ?

## 2021-08-28 NOTE — Addendum Note (Signed)
Addended by: Alois Cliche on: 08/28/2021 12:18 PM ? ? Modules accepted: Orders ? ?

## 2021-09-02 ENCOUNTER — Encounter: Payer: Self-pay | Admitting: Internal Medicine

## 2021-09-02 ENCOUNTER — Inpatient Hospital Stay: Payer: BC Managed Care – PPO | Attending: Internal Medicine | Admitting: Internal Medicine

## 2021-09-02 ENCOUNTER — Inpatient Hospital Stay: Payer: BC Managed Care – PPO

## 2021-09-02 DIAGNOSIS — D649 Anemia, unspecified: Secondary | ICD-10-CM | POA: Insufficient documentation

## 2021-09-02 DIAGNOSIS — K909 Intestinal malabsorption, unspecified: Secondary | ICD-10-CM | POA: Diagnosis not present

## 2021-09-02 DIAGNOSIS — Z803 Family history of malignant neoplasm of breast: Secondary | ICD-10-CM | POA: Insufficient documentation

## 2021-09-02 DIAGNOSIS — Z9884 Bariatric surgery status: Secondary | ICD-10-CM | POA: Diagnosis not present

## 2021-09-02 DIAGNOSIS — D508 Other iron deficiency anemias: Secondary | ICD-10-CM | POA: Diagnosis present

## 2021-09-02 LAB — COMPREHENSIVE METABOLIC PANEL
ALT: 17 U/L (ref 0–44)
AST: 16 U/L (ref 15–41)
Albumin: 4.2 g/dL (ref 3.5–5.0)
Alkaline Phosphatase: 58 U/L (ref 38–126)
Anion gap: 5 (ref 5–15)
BUN: 19 mg/dL (ref 6–20)
CO2: 26 mmol/L (ref 22–32)
Calcium: 8.6 mg/dL — ABNORMAL LOW (ref 8.9–10.3)
Chloride: 103 mmol/L (ref 98–111)
Creatinine, Ser: 0.77 mg/dL (ref 0.44–1.00)
GFR, Estimated: >60 mL/min (ref >60)
Glucose, Bld: 96 mg/dL (ref 70–99)
Potassium: 3.9 mmol/L (ref 3.5–5.1)
Sodium: 134 mmol/L — ABNORMAL LOW (ref 135–145)
Total Bilirubin: 1 mg/dL (ref 0.3–1.2)
Total Protein: 7.4 g/dL (ref 6.5–8.1)

## 2021-09-02 LAB — IRON AND TIBC
Iron: 119 ug/dL (ref 28–170)
Saturation Ratios: 26 % (ref 10.4–31.8)
TIBC: 456 ug/dL — ABNORMAL HIGH (ref 250–450)
UIBC: 337 ug/dL

## 2021-09-02 LAB — CBC WITH DIFFERENTIAL/PLATELET
Abs Immature Granulocytes: 0.07 10*3/uL (ref 0.00–0.07)
Basophils Absolute: 0 10*3/uL (ref 0.0–0.1)
Basophils Relative: 1 %
Eosinophils Absolute: 0 10*3/uL (ref 0.0–0.5)
Eosinophils Relative: 1 %
HCT: 31.8 % — ABNORMAL LOW (ref 36.0–46.0)
Hemoglobin: 9.3 g/dL — ABNORMAL LOW (ref 12.0–15.0)
Immature Granulocytes: 1 %
Lymphocytes Relative: 23 %
Lymphs Abs: 1.4 10*3/uL (ref 0.7–4.0)
MCH: 25.8 pg — ABNORMAL LOW (ref 26.0–34.0)
MCHC: 29.2 g/dL — ABNORMAL LOW (ref 30.0–36.0)
MCV: 88.1 fL (ref 80.0–100.0)
Monocytes Absolute: 0.5 10*3/uL (ref 0.1–1.0)
Monocytes Relative: 8 %
Neutro Abs: 4.1 10*3/uL (ref 1.7–7.7)
Neutrophils Relative %: 66 %
Platelets: 199 10*3/uL (ref 150–400)
RBC: 3.61 MIL/uL — ABNORMAL LOW (ref 3.87–5.11)
RDW: 16.4 % — ABNORMAL HIGH (ref 11.5–15.5)
WBC: 6.2 10*3/uL (ref 4.0–10.5)
nRBC: 0 % (ref 0.0–0.2)

## 2021-09-02 LAB — VITAMIN B12: Vitamin B-12: 775 pg/mL (ref 180–914)

## 2021-09-02 LAB — RETICULOCYTES
Immature Retic Fract: 18.4 % — ABNORMAL HIGH (ref 2.3–15.9)
RBC.: 3.6 MIL/uL — ABNORMAL LOW (ref 3.87–5.11)
Retic Count, Absolute: 91.1 10*3/uL (ref 19.0–186.0)
Retic Ct Pct: 2.5 % (ref 0.4–3.1)

## 2021-09-02 LAB — TECHNOLOGIST SMEAR REVIEW: Plt Morphology: NORMAL

## 2021-09-02 LAB — LACTATE DEHYDROGENASE: LDH: 120 U/L (ref 98–192)

## 2021-09-02 LAB — FERRITIN: Ferritin: 4 ng/mL — ABNORMAL LOW (ref 11–307)

## 2021-09-02 LAB — VITAMIN D 25 HYDROXY (VIT D DEFICIENCY, FRACTURES): Vit D, 25-Hydroxy: 10.67 ng/mL — ABNORMAL LOW (ref 30–100)

## 2021-09-02 LAB — ABO/RH: ABO/RH(D): A POS

## 2021-09-02 NOTE — Assessment & Plan Note (Addendum)
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  March 2023- hemoglobin 8.3; iron studies consistent with iron deficiency ? ?#Given the gastric bypass/poor iron absorption discussed regarding IV iron infusions. Discussed the potential acute infusion reactions with IV iron; which are quite rare.  Patient understands the risk; will proceed with infusions.  Patient had previous infusions without any issues.  ? ?#Malabsorption of the nutrients/vitamins: Check vitamin D;E;A; discussed with the patient that I would be happy to discuss the results with Dr. Harl Bowie to help her vitamin deficiencies.  Also would recommend nutritional evaluation. ? ?#Check labs today CBC CMP iron studies ferritin B12 LDH methylmalonic acid reticulocyte count review of smear; vitamin DEA ? ?Thank you Dr.Masoud  for allowing me to participate in the care of your pleasant patient. Please do not hesitate to contact me with questions or concerns in the interim. ? ?# DISPOSITION: ?# labs today- ordered ?# venofer weekly x3- start this week ASAP ?# b12 injection x1 with venofer/this week  ?# follow up in 5 week; MD- labs- cbc; possible venofer- Dr.B ? ? ?

## 2021-09-02 NOTE — Progress Notes (Signed)
New patient evaluation today.  Does have history of anemia and received one dose of PR INJ IRON DEXTRAN  at [facility] in 2018. ? ?Blood pressure in office check today is 98/71 and patient reports that is her "normal" bp reading. ?

## 2021-09-02 NOTE — Progress Notes (Signed)
[facility] ?CONSULT NOTE ? ?Patient Care Team: ?Tamara Athens, MD as PCP - General (Internal Medicine) ? ?CHIEF COMPLAINTS/PURPOSE OF CONSULTATION: ANEMIA ? ? ?HEMATOLOGY HISTORY ? ?# [facility] 2023- Hb-8.3; MCV-platelets- WBC; Iron sat- ferritin;  EGD/colonoscopy-[poor prep] x2 [3 years ago; [facility]]  ? ?# GASTRIC BY PASS [dec 2001; revision in 2017]; hernia repair-  ? ? Latest Reference Range & Units Most Recent 07/15/21 12:15  ?Iron 28 - 170 ug/dL 31 ?07/15/21 12:15 31  ?UIBC ug/dL 392 ?07/15/21 12:15 392  ?TIBC 250 - 450 ug/dL 423 ?07/15/21 12:15 423  ?Saturation Ratios 10.4 - 31.8 % 7 (L) ?07/15/21 12:15 7 (L)  ?Vitamin D, 25-Hydroxy 30 - 100 ng/mL 9.05 (L) ?07/15/21 12:15 9.05 (L)  ?Vitamin A (Retinoic Acid) 20.1 - 62.0 ug/dL <2.5 (L) ?07/15/21 12:15 <2.5 (L)  ?Vitamin E (Alpha Tocopherol) 7.0 - 25.1 mg/L 4.6 (L) ?07/15/21 12:15 4.6 (L)  ?Vitamin E(Gamma Tocopherol) 0.5 - 5.5 mg/L 0.2 (L) (C) ?07/15/21 12:15 0.2 (L) (C)  ?WBC 4.0 - 10.5 K/uL 5.1 ?07/15/21 12:15 5.1  ?RBC 3.87 - 5.11 MIL/uL 3.49 (L) ?07/15/21 12:15 3.49 (L)  ?Hemoglobin 12.0 - 15.0 g/dL 8.4 (L) ?07/15/21 12:15 8.4 (L)  ?HCT 36.0 - 46.0 % 28.8 (L) ?07/15/21 12:15 28.8 (L)  ?MCV 80.0 - 100.0 fL 82.5 ?07/15/21 12:15 82.5  ?MCH 26.0 - 34.0 pg 24.1 (L) ?07/15/21 12:15 24.1 (L)  ?MCHC 30.0 - 36.0 g/dL 29.2 (L) ?07/15/21 12:15 29.2 (L)  ?RDW 11.5 - 15.5 % 18.7 (H) ?07/15/21 12:15 18.7 (H)  ?Platelets 150 - 400 K/uL 164 ?07/15/21 12:15 164  ?(L): Data is abnormally low ?(H): Data is abnormally high ?(C): Corrected ? ?HISTORY OF PRESENTING ILLNESS:  ?[name] 56 y.o.  female pleasant patient history of gastric bypass was been referred to [place] for further evaluation of anemia. ? 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  Patient has difficulty driving at night. ? ?Patient was evaluated by ophthalmology noted to have-low vitamin D E and A levels.  Patient was started on vitamins however she stopped  given the concerns for overdose. ? ?Patient previously had IV iron infusion during side effects. ? ?Blood in stools: ? hemorrhoids ?Blood in urine:none ?Difficulty swallowing: none ?Prior blood transfusion:none ?Liver disease: none ?Alcohol: 2 beers/ day ?Bariatric surgery: yes.  ? ? ?Vaginal bleeding: none/IUD ?Prior bone marrow biopsy: none ?Oral iron: chewy gummi es;  ?Prior IV iron infusions: 2018;  ? ?  ?Review of Systems  ?Constitutional:  Positive for malaise/fatigue. Negative for chills, diaphoresis, fever and weight loss.  ?HENT:  Negative for nosebleeds and sore throat.   ?Eyes:  Negative for double vision.  ?Respiratory:  Positive for shortness of breath. Negative for cough, hemoptysis, sputum production and wheezing.   ?Cardiovascular:  Negative for chest pain, palpitations, orthopnea and leg swelling.  ?Gastrointestinal:  Negative for abdominal pain, blood in stool, constipation, diarrhea, heartburn, melena, nausea and vomiting.  ?Genitourinary:  Negative for dysuria, frequency and urgency.  ?Musculoskeletal:  Negative for back pain and joint pain.  ?Skin: Negative.  Negative for itching and rash.  ?Neurological:  Positive for tingling and weakness. Negative for dizziness, focal weakness and headaches.  ?Endo/Heme/Allergies:  Does not bruise/bleed easily.  ?Psychiatric/Behavioral:  Negative for depression. The patient is not nervous/anxious and does not have insomnia.   ? ? ?MEDICAL HISTORY:  ?Past Medical History:  ?Diagnosis Date  ? Anemia   ? COVID-19 08/2020  ? GERD (gastroesophageal reflux  disease)   ? Sleep apnea   ? Ventricular premature depolarization   ? Vitamin D deficiency   ? ? ?SURGICAL HISTORY: ?Past Surgical History:  ?Procedure Laterality Date  ? GASTRIC BYPASS    ? HERNIA REPAIR    ? ? ?SOCIAL HISTORY: ?Social History  ? ?Socioeconomic History  ? Marital status: Single  ?  Spouse name: Not on file  ? Number of children: Not on file  ? Years of education: Not on file  ? Highest education  level: Not on file  ?Occupational History  ? Not on file  ?Tobacco Use  ? Smoking status: Never  ? Smokeless tobacco: Never  ?Vaping Use  ? Vaping Use: Never used  ?Substance and Sexual Activity  ? Alcohol use: Yes  ?  Comment: occassional  ? Drug use: Never  ? Sexual activity: Not Currently  ?Other Topics Concern  ? Not on file  ?Social History Narrative  ? 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 Lives with fiance;and his son. Remote hx of smoking.   ? ?Social Determinants of Health  ? ?Financial Resource Strain: Not on file  ?Food Insecurity: Not on file  ?Transportation Needs: Not on file  ?Physical Activity: Not on file  ?Stress: Not on file  ?Social Connections: Not on file  ?Intimate Partner Violence: Not on file  ? ? ?FAMILY HISTORY: ?Family History  ?Problem Relation Age of Onset  ? Breast cancer Mother 50  ?     breast cancer twice  ? Hypertension Father   ? Thyroid disease Father   ? ? ?ALLERGIES:  has No Known Allergies. ? ?MEDICATIONS:  ?Current Outpatient Medications  ?Medication Sig Dispense Refill  ? acetaminophen (TYLENOL) 325 MG tablet Take by mouth.    ? ferrous sulfate 325 (65 FE) MG tablet chewable    ? Multiple Vitamin (MULTI-VITAMIN) tablet Take by mouth.    ? ?No current facility-administered medications for this visit.  ? ?  ?. ? ?PHYSICAL EXAMINATION: ? ? ?Vitals:  ? 09/02/21 1400  ?BP: 98/71  ?Pulse: 66  ?Resp: 18  ?Temp: 99 ?F (37.2 ?C)  ? ?Filed Weights  ? 09/02/21 1400  ?Weight: 211 lb 9.6 oz (96 kg)  ? ? ?Physical Exam ?Vitals and nursing note reviewed.  ?HENT:  ?   Head: Normocephalic and atraumatic.  ?   Mouth/Throat:  ?   Pharynx: Oropharynx is clear.  ?Eyes:  ?   Extraocular Movements: Extraocular movements intact.  ?   Pupils: Pupils are equal, round, and reactive to light.  ?Cardiovascular:  ?   Rate and Rhythm: Normal rate and regular rhythm.  ?Pulmonary:  ?   Comments: Decreased breath sounds bilaterally.  ?Abdominal:  ?   Palpations: Abdomen is  soft.  ?Musculoskeletal:     ?   General: Normal range of motion.  ?   Cervical back: Normal range of motion.  ?Skin: ?   General: Skin is warm.  ?Neurological:  ?   General: No focal deficit present.  ?   Mental Status: She is alert and oriented to person, place, and time.  ?Psychiatric:     ?   Behavior: Behavior normal.     ?   Judgment: Judgment normal.  ?  ? ?LABORATORY DATA:  ?I have reviewed the data as listed ?Lab Results  ?Component Value Date  ? WBC 6.2 09/02/2021  ? HGB 9.3 (L) 09/02/2021  ? HCT 31.8 (L) 09/02/2021  ? MCV  88.1 09/02/2021  ? PLT 199 09/02/2021  ? ?Recent Labs  ?  07/15/21 ?1215 09/02/21 ?1454  ?NA 139 134*  ?K 3.8 3.9  ?CL 106 103  ?CO2 25 26  ?GLUCOSE 101* 96  ?BUN 13 19  ?CREATININE 0.88 0.77  ?CALCIUM 8.7* 8.6*  ?GFRNONAA >60 >60  ?PROT 7.0 7.4  ?ALBUMIN 4.0 4.2  ?AST 16 16  ?ALT 16 17  ?ALKPHOS 62 58  ?BILITOT 1.2 1.0  ? ? ? ?No results found. ? ?ASSESSMENT & PLAN:  ? 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  March 2023- hemoglobin 8.3; iron studies consistent with iron deficiency ? ?#Given the gastric bypass/poor iron absorption discussed regarding IV iron infusions. Discussed the potential acute infusion reactions with IV iron; which are quite rare.  Patient understands the risk; will proceed with infusions.  Patient had previous infusions without any issues.  ? ?#Malabsorption of the nutrients/vitamins: Check vitamin D;E;A; discussed with the patient that I would be happy to discuss the results with Dr. Rebecka Apley to help her vitamin deficiencies.  Also would recommend nutritional evaluation. ? ?#Check labs today CBC CMP iron studies ferritin B12 LDH methylmalonic acid reticulocyte count review of smear; vitamin DEA ? ?Thank you Dr.Masoud  for allowing me to participate in the care of your pleasant patient. Please do not hesitate to contact me with questions or concerns in the interim. ? ?# DISPOSITION: ?# labs today- ordered ?# venofer  weekly x3- start this week ASAP ?# b12 injection x1 with venofer/this week  ?# follow up in 5 week; MD- labs- cbc; possible venofer- Dr.B ? ? ? ? ?All questions were answered. The patient knows to call

## 2021-09-03 LAB — HAPTOGLOBIN: Haptoglobin: 70 mg/dL (ref 33–346)

## 2021-09-04 LAB — VITAMIN E
Vitamin E (Alpha Tocopherol): 4.4 mg/L — ABNORMAL LOW (ref 7.0–25.1)
Vitamin E(Gamma Tocopherol): 0.2 mg/L — ABNORMAL LOW (ref 0.5–5.5)

## 2021-09-06 ENCOUNTER — Inpatient Hospital Stay: Payer: BC Managed Care – PPO

## 2021-09-06 VITALS — BP 101/63 | HR 62 | Temp 98.4°F | Resp 18

## 2021-09-06 DIAGNOSIS — D649 Anemia, unspecified: Secondary | ICD-10-CM

## 2021-09-06 DIAGNOSIS — D508 Other iron deficiency anemias: Secondary | ICD-10-CM | POA: Diagnosis not present

## 2021-09-06 LAB — METHYLMALONIC ACID, SERUM: Methylmalonic Acid, Quantitative: 135 nmol/L (ref 0–378)

## 2021-09-06 MED ORDER — IRON SUCROSE 20 MG/ML IV SOLN
200.0000 mg | Freq: Once | INTRAVENOUS | Status: AC
  Administered 2021-09-06: 200 mg via INTRAVENOUS
  Filled 2021-09-06: qty 10

## 2021-09-06 MED ORDER — SODIUM CHLORIDE 0.9 % IV SOLN
Freq: Once | INTRAVENOUS | Status: AC
  Filled 2021-09-06: qty 250

## 2021-09-06 MED ORDER — SODIUM CHLORIDE 0.9 % IV SOLN: 200.0000 mg | Freq: Once | INTRAVENOUS | Status: DC

## 2021-09-06 NOTE — Patient Instructions (Signed)

## 2021-09-06 NOTE — Progress Notes (Signed)
Per Dr. Donneta Romberg no Vitamin b12 injection at this time.

## 2021-09-08 LAB — VITAMIN A: Vitamin A (Retinoic Acid): 2.9 ug/dL — ABNORMAL LOW (ref 20.1–62.0)

## 2021-09-12 ENCOUNTER — Inpatient Hospital Stay: Payer: BC Managed Care – PPO

## 2021-09-12 ENCOUNTER — Encounter: Payer: Self-pay | Admitting: Internal Medicine

## 2021-09-12 VITALS — BP 99/62 | HR 57 | Temp 96.8°F | Resp 20

## 2021-09-12 DIAGNOSIS — D649 Anemia, unspecified: Secondary | ICD-10-CM

## 2021-09-12 DIAGNOSIS — D508 Other iron deficiency anemias: Secondary | ICD-10-CM | POA: Diagnosis not present

## 2021-09-12 MED ORDER — SODIUM CHLORIDE 0.9 % IV SOLN
Freq: Once | INTRAVENOUS | Status: AC
  Filled 2021-09-12: qty 250

## 2021-09-12 MED ORDER — SODIUM CHLORIDE 0.9 % IV SOLN: 200.0000 mg | Freq: Once | INTRAVENOUS | Status: DC

## 2021-09-12 MED ORDER — IRON SUCROSE 20 MG/ML IV SOLN
200.0000 mg | Freq: Once | INTRAVENOUS | Status: AC
  Administered 2021-09-12: 200 mg via INTRAVENOUS
  Filled 2021-09-12: qty 10

## 2021-09-12 NOTE — Progress Notes (Signed)
Pt tolerated venofer infusion well today with no problems or complaints. Pt left infusion suite stable and ambulatory.  

## 2021-09-12 NOTE — Patient Instructions (Signed)

## 2021-09-13 NOTE — Telephone Encounter (Signed)
Dr. B will call patient this afternoon.

## 2021-09-17 ENCOUNTER — Telehealth: Payer: Self-pay | Admitting: Internal Medicine

## 2021-09-17 ENCOUNTER — Encounter: Payer: Self-pay | Admitting: Internal Medicine

## 2021-09-17 NOTE — Telephone Encounter (Signed)
I have sent the prior communication from Dr. Leonard Schwartz to Dr. Juel Burrow via fax on 09/10/21

## 2021-09-17 NOTE — Telephone Encounter (Signed)
I left a voicemail for the patient regarding her concerns for vitamin deficiencies/supplementation.  I have asked the patient to call her PCP for prescription/further recommendations.  I will also faxed to PCPs office of recommendations from our pharmacist with regards to vitamin deficiency/supplementation.  GB  FYI-Dr.Masoud.

## 2021-09-19 ENCOUNTER — Inpatient Hospital Stay: Payer: BC Managed Care – PPO | Attending: Internal Medicine

## 2021-09-19 VITALS — BP 97/71 | HR 62 | Temp 96.2°F | Resp 18

## 2021-09-19 DIAGNOSIS — D508 Other iron deficiency anemias: Secondary | ICD-10-CM | POA: Insufficient documentation

## 2021-09-19 DIAGNOSIS — D649 Anemia, unspecified: Secondary | ICD-10-CM

## 2021-09-19 MED ORDER — IRON SUCROSE 20 MG/ML IV SOLN
200.0000 mg | Freq: Once | INTRAVENOUS | Status: AC
  Administered 2021-09-19: 200 mg via INTRAVENOUS
  Filled 2021-09-19: qty 10

## 2021-09-19 MED ORDER — SODIUM CHLORIDE 0.9 % IV SOLN
Freq: Once | INTRAVENOUS | Status: AC
  Filled 2021-09-19: qty 250

## 2021-09-19 MED ORDER — SODIUM CHLORIDE 0.9 % IV SOLN: 200.0000 mg | Freq: Once | INTRAVENOUS | Status: DC

## 2021-09-19 NOTE — Patient Instructions (Signed)

## 2021-10-02 ENCOUNTER — Other Ambulatory Visit: Payer: Self-pay | Admitting: *Deleted

## 2021-10-02 ENCOUNTER — Encounter: Payer: Self-pay | Admitting: Internal Medicine

## 2021-10-02 MED ORDER — VITAMIN A 15 MG/ML IM SOLN: 100000.0000 [IU] | Freq: Every day | INTRAMUSCULAR | 1 refills | Status: DC

## 2021-10-07 ENCOUNTER — Encounter: Payer: Self-pay | Admitting: Internal Medicine

## 2021-10-07 ENCOUNTER — Other Ambulatory Visit: Payer: Self-pay

## 2021-10-07 ENCOUNTER — Inpatient Hospital Stay: Payer: BC Managed Care – PPO

## 2021-10-07 ENCOUNTER — Inpatient Hospital Stay (HOSPITAL_BASED_OUTPATIENT_CLINIC_OR_DEPARTMENT_OTHER): Payer: BC Managed Care – PPO | Admitting: Internal Medicine

## 2021-10-07 VITALS — BP 106/72 | HR 60

## 2021-10-07 DIAGNOSIS — D508 Other iron deficiency anemias: Secondary | ICD-10-CM | POA: Diagnosis not present

## 2021-10-07 DIAGNOSIS — D649 Anemia, unspecified: Secondary | ICD-10-CM

## 2021-10-07 LAB — CBC WITH DIFFERENTIAL/PLATELET
Abs Immature Granulocytes: 0.01 10*3/uL (ref 0.00–0.07)
Basophils Absolute: 0 10*3/uL (ref 0.0–0.1)
Basophils Relative: 1 %
Eosinophils Absolute: 0.1 10*3/uL (ref 0.0–0.5)
Eosinophils Relative: 1 %
HCT: 33.5 % — ABNORMAL LOW (ref 36.0–46.0)
Hemoglobin: 10.3 g/dL — ABNORMAL LOW (ref 12.0–15.0)
Immature Granulocytes: 0 %
Lymphocytes Relative: 27 %
Lymphs Abs: 1.2 10*3/uL (ref 0.7–4.0)
MCH: 28.4 pg (ref 26.0–34.0)
MCHC: 30.7 g/dL (ref 30.0–36.0)
MCV: 92.3 fL (ref 80.0–100.0)
Monocytes Absolute: 0.6 10*3/uL (ref 0.1–1.0)
Monocytes Relative: 13 %
Neutro Abs: 2.7 10*3/uL (ref 1.7–7.7)
Neutrophils Relative %: 58 %
Platelets: 154 10*3/uL (ref 150–400)
RBC: 3.63 MIL/uL — ABNORMAL LOW (ref 3.87–5.11)
RDW: 17.2 % — ABNORMAL HIGH (ref 11.5–15.5)
WBC: 4.7 10*3/uL (ref 4.0–10.5)
nRBC: 0 % (ref 0.0–0.2)

## 2021-10-07 MED ORDER — SODIUM CHLORIDE 0.9 % IV SOLN: 200.0000 mg | Freq: Once | INTRAVENOUS | Status: DC

## 2021-10-07 MED ORDER — IRON SUCROSE 20 MG/ML IV SOLN
200.0000 mg | Freq: Once | INTRAVENOUS | Status: AC
  Administered 2021-10-07: 200 mg via INTRAVENOUS
  Filled 2021-10-07: qty 10

## 2021-10-07 MED ORDER — SODIUM CHLORIDE 0.9 % IV SOLN
Freq: Once | INTRAVENOUS | Status: AC
  Filled 2021-10-07: qty 250

## 2021-10-07 NOTE — Progress Notes (Signed)
Has not received Vitamin A inj due to insurance not covering medication.  Slight improvement in energy since last visit.

## 2021-10-07 NOTE — Assessment & Plan Note (Addendum)
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  March 2023- hemoglobin 8.3; iron studies consistent with iron deficiency; currently IV iron infusions.continue barimelt.   #Recommend proceeding with iron infusion today.  Patient is status she will need ongoing maintenance IV iron.  #Malabsorption/celiac deficiency of vitamin D;E;A;-defer to PCP ophthalmology for further supplementation.  # LOW B12- MAY 2023-775; recommend continue PO B12.   # DISPOSITION: # venofer  # venofer weekly x2- # follow up in 3months; MD- labs- cbc; possible venofer- Dr.B   

## 2021-10-07 NOTE — Progress Notes (Signed)
[facility] CONSULT NOTE  Patient Care Team: Corky Downs, MD as PCP - General (Internal Medicine) Earna Coder, MD as Consulting Physician (Oncology)  CHIEF COMPLAINTS/PURPOSE OF CONSULTATION: ANEMIA   HEMATOLOGY HISTORY  # [facility] 2023- Hb-8.3; MCV-platelets- WBC; Iron sat- ferritin;  EGD/colonoscopy-[poor prep] x2 [3 years ago; [facility]]   # GASTRIC BY PASS [dec 2001; revision in 2017]; hernia repair-   #Severe vitamin a deficiency/D EK    Latest Reference Range & Units Most Recent 07/15/21 12:15  Iron 28 - 170 ug/dL 31 3/57/01 77:93 31  UIBC ug/dL 903 0/09/23 30:07 622  TIBC 250 - 450 ug/dL 633 3/54/56 25:63 893  Saturation Ratios 10.4 - 31.8 % 7 (L) 07/15/21 12:15 7 (L)  Vitamin D, 25-Hydroxy 30 - 100 ng/mL 9.05 (L) 07/15/21 12:15 9.05 (L)  Vitamin A (Retinoic Acid) 20.1 - 62.0 ug/dL <7.3 (L) 08/17/74 81:15 <2.5 (L)  Vitamin E (Alpha Tocopherol) 7.0 - 25.1 mg/L 4.6 (L) 07/15/21 12:15 4.6 (L)  Vitamin E(Gamma Tocopherol) 0.5 - 5.5 mg/L 0.2 (L) (C) 07/15/21 12:15 0.2 (L) (C)  WBC 4.0 - 10.5 K/uL 5.1 07/15/21 12:15 5.1  RBC 3.87 - 5.11 MIL/uL 3.49 (L) 07/15/21 12:15 3.49 (L)  Hemoglobin 12.0 - 15.0 g/dL 8.4 (L) 11/14/18 35:59 8.4 (L)  HCT 36.0 - 46.0 % 28.8 (L) 07/15/21 12:15 28.8 (L)  MCV 80.0 - 100.0 fL 82.5 07/15/21 12:15 82.5  MCH 26.0 - 34.0 pg 24.1 (L) 07/15/21 12:15 24.1 (L)  MCHC 30.0 - 36.0 g/dL 74.1 (L) 6/38/45 36:46 29.2 (L)  RDW 11.5 - 15.5 % 18.7 (H) 07/15/21 12:15 18.7 (H)  Platelets 150 - 400 K/uL 164 07/15/21 12:15 164  (L): Data is abnormally low (H): Data is abnormally high (C): Corrected  HISTORY OF PRESENTING ILLNESS: Alone.  Ambulating independently.  Tamara Kelly 56 y.o.  female pleasant patient history of gastric bypass/malabsorption iron deficiency; multiple vitamin deficiencies is here for follow-up.  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  Patient status post IV iron infusion.  Improvement of energy levels.   Review of Systems  Constitutional:  Positive for malaise/fatigue. Negative for chills, diaphoresis, fever and weight loss.  HENT:  Negative for nosebleeds and sore throat.   Eyes:  Negative for double vision.  Respiratory:  Positive for shortness of breath. Negative for cough, hemoptysis, sputum production and wheezing.   Cardiovascular:  Negative for chest pain, palpitations, orthopnea and leg swelling.  Gastrointestinal:  Negative for abdominal pain, blood in stool, constipation, diarrhea, heartburn, melena, nausea and vomiting.  Genitourinary:  Negative for dysuria, frequency and urgency.  Musculoskeletal:  Negative for back pain and joint pain.  Skin: Negative.  Negative for itching and rash.  Neurological:  Positive for tingling and weakness. Negative for dizziness, focal weakness and headaches.  Endo/Heme/Allergies:  Does not bruise/bleed easily.  Psychiatric/Behavioral:  Negative for depression. The patient is not nervous/anxious and does not have insomnia.      MEDICAL HISTORY:  Past Medical History:  Diagnosis Date   Anemia    COVID-19 08/2020   GERD (gastroesophageal reflux disease)    Sleep apnea    Ventricular premature depolarization    Vitamin D deficiency     SURGICAL HISTORY: Past Surgical History:  Procedure Laterality Date   GASTRIC BYPASS     HERNIA REPAIR      SOCIAL HISTORY: Social History   Socioeconomic History   Marital status: Single  Spouse name: Not on file   Number of children: Not on file   Years of education: Not on file   Highest education level: Not on file  Occupational History   Not on file  Tobacco Use   Smoking status: Never   Smokeless tobacco: Never  Vaping Use   Vaping Use: Never used  Substance and Sexual Activity   Alcohol use: Yes    Comment: occassional   Drug use: Never   Sexual activity: Not Currently  Other Topics Concern   Not  on file  Social History Narrative   Does medical billing; 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 Lives with fiance;and his son. Remote hx of smoking.    Social Determinants of Health   Financial Resource Strain: Not on file  Food Insecurity: Not on file  Transportation Needs: Not on file  Physical Activity: Not on file  Stress: Not on file  Social Connections: Not on file  Intimate Partner Violence: Not on file    FAMILY HISTORY: Family History  Problem Relation Age of Onset   Breast cancer Mother 46       breast cancer twice   Hypertension Father    Thyroid disease Father     ALLERGIES:  has No Known Allergies.  MEDICATIONS:  Current Outpatient Medications  Medication Sig Dispense Refill   acetaminophen (TYLENOL) 325 MG tablet Take by mouth.     Ferrous Gluconate-C-Folic Acid (IRON-C PO) Take by mouth. Chewable 45mg  iron + Vit C     Multiple Vitamin (MULTI-VITAMIN) tablet Take by mouth. gummy     VITAMIN D, ERGOCALCIFEROL, PO Take by mouth. Vitamin D 5000 IU with K2 200 mcg     vitamin k 100 MCG tablet Take 100 mcg by mouth daily. K1 120 mcg     ferrous sulfate 325 (65 FE) MG tablet chewable (Patient not taking: Reported on 10/07/2021)     vitamin A (AQUASOL-A) 50000 units/mL injection Inject 2 mLs (100,000 Units total) into the muscle daily. (Patient not taking: Reported on 10/07/2021) 14 mL 1   No current facility-administered medications for this visit.     10/09/2021  PHYSICAL EXAMINATION:   Vitals:   10/07/21 1500  BP: 114/78  Pulse: 70  Resp: 16  Temp: 98.6 F (37 C)    Filed Weights   10/07/21 1500  Weight: 210 lb 3.2 oz (95.3 kg)     Physical Exam Vitals and nursing note reviewed.  HENT:     Head: Normocephalic and atraumatic.     Mouth/Throat:     Pharynx: Oropharynx is clear.  Eyes:     Extraocular Movements: Extraocular movements intact.     Pupils: Pupils are equal, round, and reactive to light.  Cardiovascular:     Rate and Rhythm:  Normal rate and regular rhythm.  Pulmonary:     Comments: Decreased breath sounds bilaterally.  Abdominal:     Palpations: Abdomen is soft.  Musculoskeletal:        General: Normal range of motion.     Cervical back: Normal range of motion.  Skin:    General: Skin is warm.  Neurological:     General: No focal deficit present.     Mental Status: She is alert and oriented to person, place, and time.  Psychiatric:        Behavior: Behavior normal.        Judgment: Judgment normal.      LABORATORY DATA:  I have reviewed  the data as listed Lab Results  Component Value Date   WBC 4.7 10/07/2021   HGB 10.3 (L) 10/07/2021   HCT 33.5 (L) 10/07/2021   MCV 92.3 10/07/2021   PLT 154 10/07/2021   Recent Labs    07/15/21 1215 09/02/21 1454  NA 139 134*  K 3.8 3.9  CL 106 103  CO2 25 26  GLUCOSE 101* 96  BUN 13 19  CREATININE 0.88 0.77  CALCIUM 8.7* 8.6*  GFRNONAA >60 >60  PROT 7.0 7.4  ALBUMIN 4.0 4.2  AST 16 16  ALT 16 17  ALKPHOS 62 58  BILITOT 1.2 1.0     No results found.  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  March 2023- hemoglobin 8.3; iron studies consistent with iron deficiency; currently IV iron infusions.continue barimelt.   #Recommend proceeding with iron infusion today.  Patient is status she will need ongoing maintenance IV iron.  #Malabsorption/celiac deficiency of vitamin D;E;A;-defer to PCP ophthalmology for further supplementation.  # LOW B12- MAY 2023-775; recommend continue PO B12.   # DISPOSITION: # venofer  # venofer weekly x2- # follow up in 18months; MD- labs- cbc; possible venofer- Dr.B     All questions were answered. The patient knows to call the clinic with any problems, questions or concerns.    Earna Coder, MD 10/07/2021 6:42 PM

## 2021-10-09 ENCOUNTER — Ambulatory Visit (INDEPENDENT_AMBULATORY_CARE_PROVIDER_SITE_OTHER): Payer: BC Managed Care – PPO | Admitting: Obstetrics and Gynecology

## 2021-10-09 ENCOUNTER — Encounter: Payer: Self-pay | Admitting: Obstetrics and Gynecology

## 2021-10-09 VITALS — BP 110/80 | HR 76 | Ht 66.0 in | Wt 210.9 lb

## 2021-10-09 DIAGNOSIS — Z30431 Encounter for routine checking of intrauterine contraceptive device: Secondary | ICD-10-CM | POA: Diagnosis not present

## 2021-10-09 DIAGNOSIS — Z7689 Persons encountering health services in other specified circumstances: Secondary | ICD-10-CM

## 2021-10-09 NOTE — Progress Notes (Signed)
HPI:      Tamara Kelly is a 56 y.o. [phone] who LMP was No LMP recorded. Patient is postmenopausal.  Subjective:   She presents today to establish OB/GYN care and to have her IUD removed.  She states that she has a Mirena in place and has been there for more than 7 years.  She is not sure if she is in menopause but believes she probably is.  She does not want to become pregnant. She states that her Pap smear is due next year.  She is "overdue" for mammogram.  She does state her mother had breast cancer.    Hx: The following portions of the patient's history were reviewed and updated as appropriate:             She  has a past medical history of Anemia, COVID-19 (08/2020), GERD (gastroesophageal reflux disease), Sleep apnea, Ventricular premature depolarization, and Vitamin D deficiency. She does not have any pertinent problems on file. She  has a past surgical history that includes Hernia repair and Gastric bypass. Her family history includes Breast cancer (age of onset: 54) in her mother; Hypertension in her father; Thyroid disease in her father. She  reports that she has never smoked. She has never used smokeless tobacco. She reports that she does not currently use alcohol. She reports that she does not use drugs. She has a current medication list which includes the following prescription(s): acetaminophen, ferrous gluconate-c-folic acid, multi-vitamin, vitamin a, ergocalciferol, and vitamin k. She has No Known Allergies.       Review of Systems:  Review of Systems  Constitutional: Denied constitutional symptoms, night sweats, recent illness, fatigue, fever, insomnia and weight loss.  Eyes: Denied eye symptoms, eye pain, photophobia, vision change and visual disturbance.  Ears/Nose/Throat/Neck: Denied ear, nose, throat or neck symptoms, hearing loss, nasal discharge, sinus congestion and sore throat.  Cardiovascular: Denied cardiovascular symptoms, arrhythmia, chest pain/pressure,  edema, exercise intolerance, orthopnea and palpitations.  Respiratory: Denied pulmonary symptoms, asthma, pleuritic pain, productive sputum, cough, dyspnea and wheezing.  Gastrointestinal: Denied, gastro-esophageal reflux, melena, nausea and vomiting.  Genitourinary: Denied genitourinary symptoms including symptomatic vaginal discharge, pelvic relaxation issues, and urinary complaints.  Musculoskeletal: Denied musculoskeletal symptoms, stiffness, swelling, muscle weakness and myalgia.  Dermatologic: Denied dermatology symptoms, rash and scar.  Neurologic: Denied neurology symptoms, dizziness, headache, neck pain and syncope.  Psychiatric: Denied psychiatric symptoms, anxiety and depression.  Endocrine: Denied endocrine symptoms including hot flashes and night sweats.   Meds:   Current Outpatient Medications on File Prior to Visit  Medication Sig Dispense Refill   acetaminophen (TYLENOL) 325 MG tablet Take by mouth.     Ferrous Gluconate-C-Folic Acid (IRON-C PO) Take by mouth. Chewable 45mg  iron + Vit C     Multiple Vitamin (MULTI-VITAMIN) tablet Take by mouth. gummy     vitamin A (AQUASOL-A) 50000 units/mL injection Inject 2 mLs (100,000 Units total) into the muscle daily. 14 mL 1   VITAMIN D, ERGOCALCIFEROL, PO Take by mouth. Vitamin D 5000 IU with K2 200 mcg     vitamin k 100 MCG tablet Take 100 mcg by mouth daily. K1 120 mcg     No current facility-administered medications on file prior to visit.      Objective:     Vitals:   10/09/21 1334  BP: 110/80  Pulse: 76   Filed Weights   10/09/21 1334  Weight: 210 lb 14.4 oz (95.7 kg)  Physical examination   Pelvic:   Vulva: Normal appearance.  No lesions.  Vagina: No lesions or abnormalities noted.  Support: Normal pelvic support.  Urethra No masses tenderness or scarring.  Meatus Normal size without lesions or prolapse.  Cervix: Normal appearance.  No lesions. IUD strings noted at cervical os.  Anus: Normal  exam.  No lesions.  Perineum: Normal exam.  No lesions.        Bimanual   Uterus: Normal size.  Non-tender.  Mobile.  AV.  Adnexae: No masses.  Non-tender to palpation.  Cul-de-sac: Negative for abnormality.   IUD Removal Strings of IUD identified and grasped.  IUD removed without problem.  Pt tolerated this well.  IUD noted to be intact.            Assessment:    [id number] Patient Active Problem List   Diagnosis Date Noted   Symptomatic anemia 09/02/2021   Vitamin D deficiency 08/27/2021   Iron deficiency anemia secondary to inadequate dietary iron intake 08/27/2021   Gastric bypass status for obesity 08/27/2021   Visual loss 08/27/2021   History of colon polyps 11/20/2017   Cardiac murmur 11/20/2017   Ventricular premature depolarization 07/13/2015   Incisional hernia 10/11/2014   Sleep apnea 08/02/2012   GERD (gastroesophageal reflux disease) 01/02/2012     1. Establishing care with new doctor, encounter for   2. Encounter for routine checking of intrauterine contraceptive device (IUD)     IUD removed at patient request   Plan:            1.  Discussed FSH testing versus expectant management to see if she has menses.  Possibility of pregnancy discussed. (Unlikely but not impossible).  Patient is okay with this at this time.  Declined FSH testing  2.  Encourage mammography now and Pap smear next year. Orders No orders of the defined types were placed in this encounter.   No orders of the defined types were placed in this encounter.     F/U  Return for Annual Physical.  Or if she is still having regular menses. I spent 31 minutes involved in the care of this patient preparing to see the patient by obtaining and reviewing her medical history (including labs, imaging tests and prior procedures), documenting clinical information in the electronic health record (EHR), counseling and coordinating care plans, writing and sending prescriptions, ordering tests or procedures  and in direct communicating with the patient and medical staff discussing pertinent items from her history and physical exam.  Tamara Kelly, M.D. 10/09/2021 2:08 PM

## 2021-10-09 NOTE — Progress Notes (Signed)
Patient presents today for IUD removal. She states her IUD was inserted approximately 7 years ago by a [facility]. Patient states no bleeding or further issues with IUD.

## 2021-10-14 ENCOUNTER — Ambulatory Visit: Payer: BC Managed Care – PPO

## 2021-10-16 ENCOUNTER — Inpatient Hospital Stay: Payer: BC Managed Care – PPO

## 2021-10-16 VITALS — BP 106/60 | HR 68 | Temp 97.6°F | Resp 16

## 2021-10-16 DIAGNOSIS — D508 Other iron deficiency anemias: Secondary | ICD-10-CM | POA: Diagnosis not present

## 2021-10-16 DIAGNOSIS — D649 Anemia, unspecified: Secondary | ICD-10-CM

## 2021-10-16 MED ORDER — SODIUM CHLORIDE 0.9 % IV SOLN: 200.0000 mg | Freq: Once | INTRAVENOUS | Status: DC

## 2021-10-16 MED ORDER — IRON SUCROSE 20 MG/ML IV SOLN
200.0000 mg | Freq: Once | INTRAVENOUS | Status: AC
  Administered 2021-10-16: 200 mg via INTRAVENOUS
  Filled 2021-10-16: qty 10

## 2021-10-16 MED ORDER — SODIUM CHLORIDE 0.9 % IV SOLN
Freq: Once | INTRAVENOUS | Status: AC
  Filled 2021-10-16: qty 250

## 2021-10-21 ENCOUNTER — Inpatient Hospital Stay: Payer: BC Managed Care – PPO | Attending: Internal Medicine

## 2021-10-21 DIAGNOSIS — D508 Other iron deficiency anemias: Secondary | ICD-10-CM | POA: Insufficient documentation

## 2021-10-21 MED FILL — Iron Sucrose Inj 20 MG/ML (Fe Equiv): INTRAVENOUS | Qty: 10 | Status: AC

## 2021-10-25 ENCOUNTER — Telehealth: Payer: Self-pay

## 2021-10-25 NOTE — Telephone Encounter (Signed)
Patient called to reschedule her missed infusion appointment on 7/3.  *Based on appointment desk Patient is rescheduled for 7/10 @3pm .  Patient was notified and agreeable to this.*

## 2021-10-28 ENCOUNTER — Inpatient Hospital Stay: Payer: BC Managed Care – PPO

## 2021-10-28 VITALS — BP 103/59 | HR 64 | Temp 97.2°F | Resp 18

## 2021-10-28 DIAGNOSIS — D508 Other iron deficiency anemias: Secondary | ICD-10-CM | POA: Diagnosis not present

## 2021-10-28 DIAGNOSIS — D649 Anemia, unspecified: Secondary | ICD-10-CM

## 2021-10-28 MED ORDER — SODIUM CHLORIDE 0.9 % IV SOLN
200.0000 mg | Freq: Once | INTRAVENOUS | Status: AC
  Administered 2021-10-28: 200 mg via INTRAVENOUS
  Filled 2021-10-28: qty 10

## 2021-10-28 MED ORDER — SODIUM CHLORIDE 0.9 % IV SOLN
Freq: Once | INTRAVENOUS | Status: AC
  Filled 2021-10-28: qty 250

## 2021-10-28 NOTE — Patient Instructions (Signed)

## 2021-11-01 ENCOUNTER — Ambulatory Visit: Payer: BC Managed Care – PPO

## 2022-01-01 ENCOUNTER — Encounter: Payer: Self-pay | Admitting: Gastroenterology

## 2022-01-02 ENCOUNTER — Encounter: Payer: Self-pay | Admitting: Gastroenterology

## 2022-01-02 ENCOUNTER — Other Ambulatory Visit
Admission: RE | Admit: 2022-01-02 | Discharge: 2022-01-02 | Disposition: A | Discharge status: Home / Self Care | Payer: BC Managed Care – PPO | Source: Ambulatory Visit | Attending: Gastroenterology | Admitting: Gastroenterology

## 2022-01-02 ENCOUNTER — Ambulatory Visit (INDEPENDENT_AMBULATORY_CARE_PROVIDER_SITE_OTHER): Payer: BC Managed Care – PPO | Admitting: Gastroenterology

## 2022-01-02 ENCOUNTER — Other Ambulatory Visit: Payer: Self-pay

## 2022-01-02 VITALS — BP 113/76 | HR 81 | Temp 98.8°F | Ht 66.0 in | Wt 208.4 lb

## 2022-01-02 DIAGNOSIS — Z9884 Bariatric surgery status: Secondary | ICD-10-CM | POA: Insufficient documentation

## 2022-01-02 DIAGNOSIS — Z86010 Personal history of colonic polyps: Secondary | ICD-10-CM

## 2022-01-02 DIAGNOSIS — Z09 Encounter for follow-up examination after completed treatment for conditions other than malignant neoplasm: Secondary | ICD-10-CM | POA: Diagnosis not present

## 2022-01-02 DIAGNOSIS — D508 Other iron deficiency anemias: Secondary | ICD-10-CM

## 2022-01-02 LAB — COMPREHENSIVE METABOLIC PANEL
ALT: 24 U/L (ref 0–44)
AST: 20 U/L (ref 15–41)
Albumin: 4.5 g/dL (ref 3.5–5.0)
Alkaline Phosphatase: 60 U/L (ref 38–126)
Anion gap: 8 (ref 5–15)
BUN: 15 mg/dL (ref 6–20)
CO2: 26 mmol/L (ref 22–32)
Calcium: 9.4 mg/dL (ref 8.9–10.3)
Chloride: 107 mmol/L (ref 98–111)
Creatinine, Ser: 0.79 mg/dL (ref 0.44–1.00)
GFR, Estimated: >60 mL/min (ref >60)
Glucose, Bld: 96 mg/dL (ref 70–99)
Potassium: 4.1 mmol/L (ref 3.5–5.1)
Sodium: 141 mmol/L (ref 135–145)
Total Bilirubin: 1.1 mg/dL (ref 0.3–1.2)
Total Protein: 7.7 g/dL (ref 6.5–8.1)

## 2022-01-02 LAB — CBC
HCT: 38.6 % (ref 36.0–46.0)
Hemoglobin: 12.2 g/dL (ref 12.0–15.0)
MCH: 29.7 pg (ref 26.0–34.0)
MCHC: 31.6 g/dL (ref 30.0–36.0)
MCV: 93.9 fL (ref 80.0–100.0)
Platelets: 175 10*3/uL (ref 150–400)
RBC: 4.11 MIL/uL (ref 3.87–5.11)
RDW: 12.6 % (ref 11.5–15.5)
WBC: 5.4 10*3/uL (ref 4.0–10.5)
nRBC: 0 % (ref 0.0–0.2)

## 2022-01-02 LAB — FOLATE: Folate: 32 ng/mL (ref >5.9)

## 2022-01-02 LAB — IRON AND TIBC
Iron: 102 ug/dL (ref 28–170)
Saturation Ratios: 27 % (ref 10.4–31.8)
TIBC: 382 ug/dL (ref 250–450)
UIBC: 280 ug/dL

## 2022-01-02 LAB — FERRITIN: Ferritin: 41 ng/mL (ref 11–307)

## 2022-01-02 LAB — VITAMIN D 25 HYDROXY (VIT D DEFICIENCY, FRACTURES): Vit D, 25-Hydroxy: 14.78 ng/mL — ABNORMAL LOW (ref 30–100)

## 2022-01-02 LAB — VITAMIN B12: Vitamin B-12: 656 pg/mL (ref 180–914)

## 2022-01-02 MED ORDER — GOLYTELY 236 G PO SOLR: 4000.0000 mL | Freq: Once | ORAL | 0 refills | Status: AC

## 2022-01-02 NOTE — Progress Notes (Signed)
Arlyss Repress, MD [address]  [address]  [place], [place] [address]  Main: [phone]  Fax: [phone]    Gastroenterology Consultation  Referring Provider:     Corky Downs, MD Primary Care Physician:  Corky Downs, MD Primary Gastroenterologist:  Dr. Arlyss Repress Reason for Consultation: Abdominal bloating, change in bowel habits        HPI:   Tamara Kelly is a 56 y.o. female referred by Dr. Corky Downs, MD  for consultation & management of chronic symptoms of pain in bowel habits.  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  Patient, overall lost about 250 pounds.  She gained about 20 pounds within last 3 years, however her weight has been stable within last 1 year.  Patient has severe fat-soluble vitamins deficiency, vitamins A, D, E and K for which she has been getting replacement orally.  She also has severe iron deficiency anemia, receives IV iron as needed by her hematologist Dr. Donneta Romberg.  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  She had attempts to undergo colonoscopy in the past but she had a poor prep.  Despite doing 2-day prep.  She reports having 3 mushy bowel movements daily, sensation of incomplete emptying and mild intermittent abdominal bloating.  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  She denies any abdominal pain, nausea or vomiting, reflux, regurgitation  She does not smoke or drink alcohol  NSAIDs: None  Antiplts/Anticoagulants/Anti thrombotics: None She denies any family history of GI malignancy GI Procedures: Reviewed under care everywhere  Past Medical History:  Diagnosis Date   Anemia    COVID-19 08/2020   GERD (gastroesophageal reflux disease)    Sleep apnea    Ventricular premature depolarization    Vitamin D deficiency     Past Surgical History:  Procedure Laterality Date    GASTRIC BYPASS     HERNIA REPAIR       Current Outpatient Medications:    acetaminophen (TYLENOL) 325 MG tablet, Take by mouth., Disp: , Rfl:    Ferrous Gluconate-C-Folic Acid (IRON-C PO), Take by mouth. Chewable 45mg  iron + Vit C, Disp: , Rfl:    Multiple Vitamin (MULTI-VITAMIN) tablet, Take by mouth. gummy, Disp: , Rfl:    polyethylene glycol (GOLYTELY) 236 g solution, Take 4,000 mLs by mouth once for 1 dose., Disp: 4000 mL, Rfl: 0   VITAMIN D, ERGOCALCIFEROL, PO, Take by mouth. Vitamin D 5000 IU with K2 200 mcg, Disp: , Rfl:    vitamin k 100 MCG tablet, Take 100 mcg by mouth daily. K1 120 mcg, Disp: , Rfl:    vitamin A (AQUASOL-A) 50000 units/mL injection, Inject 2 mLs (100,000 Units total) into the muscle daily. (Patient not taking: Reported on 01/02/2022), Disp: 14 mL, Rfl: 1   Family History  Problem Relation Age of Onset   Breast cancer Mother 39       breast cancer twice   Hypertension Father    Thyroid disease Father      Social History   Tobacco Use   Smoking status: Never   Smokeless tobacco: Never  Vaping Use   Vaping Use: Never used  Substance Use Topics   Alcohol use: Not Currently    Comment: occassional   Drug use: Never    Allergies as of 01/02/2022   (No Known Allergies)    Review of Systems:  All systems reviewed and negative except where noted in HPI.   Physical Exam:  BP 113/76 (BP Location: Left Arm, Patient Position: Sitting, Cuff Size: Normal)   Pulse 81   Temp 98.8 F (37.1 C) (Oral)   Ht 5\' 6"  (1.676 m)   Wt 208 lb 6 oz (94.5 kg)   BMI 33.63 kg/m  No LMP recorded. Patient is postmenopausal.  General:   Alert,  Well-developed, well-nourished, pleasant and cooperative in NAD Head:  Normocephalic and atraumatic. Eyes:  Sclera clear, no icterus.   Conjunctiva pink. Ears:  Normal auditory acuity. Nose:  No deformity, discharge, or lesions. Mouth:  No deformity or lesions,oropharynx pink & moist. Neck:  Supple; no masses or  thyromegaly. Lungs:  Respirations even and unlabored.  Clear throughout to auscultation.   No wheezes, crackles, or rhonchi. No acute distress. Heart:  Regular rate and rhythm; no murmurs, clicks, rubs, or gallops. Abdomen:  Normal bowel sounds. Soft, non-tender and non-distended without masses, hepatosplenomegaly or hernias noted.  No guarding or rebound tenderness.   Rectal: Not performed Msk:  Symmetrical without gross deformities. Good, equal movement & strength bilaterally. Pulses:  Normal pulses noted. Extremities:  No clubbing or edema.  No cyanosis. Neurologic:  Alert and oriented x3;  grossly normal neurologically. Skin:  Intact without significant lesions or rashes. No jaundice. Psych:  Alert and cooperative. Normal mood and affect.  Imaging Studies: Reviewed  Assessment and Plan:   Tamara Kelly is a 56 y.o. Caucasian female with history of Roux-en-Y gastric bypass, s/p revision 5 years ago is seen in consultation for change in bowel habits, history of colon polyps, to discuss about colonoscopy.  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  History of colon polyps Recommend 2-day prep with MiraLAX 2 days before and GoLytely the day before, low residue diet for 5 days before the colonoscopy  S/p Roux-en-Y gastric bypass with iron deficiency anemia and fat-soluble vitamin deficiencies Recheck CBC, CMP, iron panel, vitamin A, D, E and K levels, serum copper, selenium, zinc and manganese levels Advised patient to increase oral intake of vitamin A supplements Check celiac disease panel Pancreatic fecal elastase levels because patients with Roux-en-Y gastric bypass can develop secondary pancreatic insufficiency which can also contribute to fat-soluble vitamin deficiencies  Follow up in 3 months  59, MD

## 2022-01-03 LAB — MISC LABCORP TEST (SEND OUT)
Labcorp test code: 71589
Labcorp test code: 716910

## 2022-01-04 LAB — CELIAC DISEASE PANEL
Endomysial Ab, IgA: NEGATIVE
IgA: 261 mg/dL (ref 87–352)
Tissue Transglutaminase Ab, IgA: <2 U/mL (ref 0–3)

## 2022-01-04 LAB — VITAMIN K1, SERUM: VITAMIN K1: <0.1 ng/mL — ABNORMAL LOW (ref 0.10–2.20)

## 2022-01-05 LAB — VITAMIN A: Vitamin A (Retinoic Acid): 13.6 ug/dL — ABNORMAL LOW (ref 20.1–62.0)

## 2022-01-05 LAB — COPPER, SERUM: Copper: 76 ug/dL — ABNORMAL LOW (ref 80–158)

## 2022-01-05 LAB — ZINC: Zinc: 61 ug/dL (ref 44–115)

## 2022-01-05 LAB — VITAMIN E
Vitamin E (Alpha Tocopherol): 5.3 mg/L — ABNORMAL LOW (ref 7.0–25.1)
Vitamin E(Gamma Tocopherol): 0.2 mg/L — ABNORMAL LOW (ref 0.5–5.5)

## 2022-01-06 ENCOUNTER — Telehealth: Payer: Self-pay

## 2022-01-06 NOTE — Telephone Encounter (Signed)
-----   Message from Lin Landsman, MD sent at 01/06/2022  1:00 PM EDT ----- Patient has vitamin D deficiency, recommend prescription for vitamin D 50 K units weekly for 12 weeks Copper deficiency, recommend copper dietary supplement 2 mg daily for 3 months Vitamin A deficiency which is overall improving compared to 4 months ago, she will continue taking vitamin A supplements Vitamins E and K deficiencies, recommend vitamin E dietary supplement oral capsule oral tablet 100 to 400 units/day based on the availability Recommend vitamin K dietary supplement 1 to 2 mg once daily for 1 month  I am glad to see that she does not have iron deficiency, B12 or folate deficiency and her anemia has resolved.  I will see her for follow-up in December and will recheck her abnormal labs  Rohini Vanga

## 2022-01-06 NOTE — Telephone Encounter (Signed)
Called and left a message for call back  

## 2022-01-07 ENCOUNTER — Inpatient Hospital Stay: Payer: BC Managed Care – PPO | Admitting: Internal Medicine

## 2022-01-07 ENCOUNTER — Inpatient Hospital Stay: Payer: BC Managed Care – PPO

## 2022-01-07 NOTE — Telephone Encounter (Signed)
Called and left a message for call back  

## 2022-01-08 MED FILL — Iron Sucrose Inj 20 MG/ML (Fe Equiv): INTRAVENOUS | Qty: 10 | Status: AC

## 2022-01-08 NOTE — Telephone Encounter (Signed)
Patient verbalized understanding of results and she will start taking these supplements

## 2022-01-09 ENCOUNTER — Inpatient Hospital Stay (HOSPITAL_BASED_OUTPATIENT_CLINIC_OR_DEPARTMENT_OTHER): Payer: BC Managed Care – PPO | Admitting: Medical Oncology

## 2022-01-09 ENCOUNTER — Inpatient Hospital Stay: Payer: BC Managed Care – PPO | Attending: Internal Medicine

## 2022-01-09 ENCOUNTER — Encounter: Payer: Self-pay | Admitting: Medical Oncology

## 2022-01-09 ENCOUNTER — Inpatient Hospital Stay: Payer: BC Managed Care – PPO

## 2022-01-09 VITALS — BP 103/74 | HR 66 | Temp 98.1°F | Resp 18 | Wt 208.5 lb

## 2022-01-09 DIAGNOSIS — Z803 Family history of malignant neoplasm of breast: Secondary | ICD-10-CM | POA: Insufficient documentation

## 2022-01-09 DIAGNOSIS — E509 Vitamin A deficiency, unspecified: Secondary | ICD-10-CM | POA: Diagnosis not present

## 2022-01-09 DIAGNOSIS — D696 Thrombocytopenia, unspecified: Secondary | ICD-10-CM | POA: Diagnosis not present

## 2022-01-09 DIAGNOSIS — E560 Deficiency of vitamin E: Secondary | ICD-10-CM

## 2022-01-09 DIAGNOSIS — D649 Anemia, unspecified: Secondary | ICD-10-CM

## 2022-01-09 DIAGNOSIS — E559 Vitamin D deficiency, unspecified: Secondary | ICD-10-CM | POA: Insufficient documentation

## 2022-01-09 DIAGNOSIS — Z9884 Bariatric surgery status: Secondary | ICD-10-CM | POA: Insufficient documentation

## 2022-01-09 DIAGNOSIS — D508 Other iron deficiency anemias: Secondary | ICD-10-CM

## 2022-01-09 DIAGNOSIS — E538 Deficiency of other specified B group vitamins: Secondary | ICD-10-CM

## 2022-01-09 DIAGNOSIS — K909 Intestinal malabsorption, unspecified: Secondary | ICD-10-CM | POA: Insufficient documentation

## 2022-01-09 DIAGNOSIS — T148XXA Other injury of unspecified body region, initial encounter: Secondary | ICD-10-CM

## 2022-01-09 LAB — CBC WITH DIFFERENTIAL/PLATELET
Abs Immature Granulocytes: 0.01 10*3/uL (ref 0.00–0.07)
Basophils Absolute: 0 10*3/uL (ref 0.0–0.1)
Basophils Relative: 1 %
Eosinophils Absolute: 0.1 10*3/uL (ref 0.0–0.5)
Eosinophils Relative: 2 %
HCT: 35.9 % — ABNORMAL LOW (ref 36.0–46.0)
Hemoglobin: 11.7 g/dL — ABNORMAL LOW (ref 12.0–15.0)
Immature Granulocytes: 0 %
Lymphocytes Relative: 33 %
Lymphs Abs: 1.6 10*3/uL (ref 0.7–4.0)
MCH: 30.4 pg (ref 26.0–34.0)
MCHC: 32.6 g/dL (ref 30.0–36.0)
MCV: 93.2 fL (ref 80.0–100.0)
Monocytes Absolute: 0.5 10*3/uL (ref 0.1–1.0)
Monocytes Relative: 10 %
Neutro Abs: 2.5 10*3/uL (ref 1.7–7.7)
Neutrophils Relative %: 54 %
Platelets: 139 10*3/uL — ABNORMAL LOW (ref 150–400)
RBC: 3.85 MIL/uL — ABNORMAL LOW (ref 3.87–5.11)
RDW: 12.8 % (ref 11.5–15.5)
WBC: 4.7 10*3/uL (ref 4.0–10.5)
nRBC: 0 % (ref 0.0–0.2)

## 2022-01-09 NOTE — Progress Notes (Signed)
[facility] NOTE  Patient Care Team: Cletis Athens, MD as PCP - General (Internal Medicine) Cammie Sickle, MD as Consulting Physician (Oncology)  CHIEF COMPLAINTS/PURPOSE OF CONSULTATION: ANEMIA   HEMATOLOGY HISTORY  # [facility] 2023- Hb-8.3; MCV-platelets- WBC; Iron sat- ferritin;  EGD/colonoscopy-[poor prep] x2 [3 years ago; [facility]]   # GASTRIC BY PASS [dec 2001; revision in 2017]; hernia repair-   #Severe vitamin a deficiency/D EK    Latest Reference Range & Units Most Recent 07/15/21 12:15  Iron 28 - 170 ug/dL 31 07/15/21 12:15 31  UIBC ug/dL 392 07/15/21 12:15 392  TIBC 250 - 450 ug/dL 423 07/15/21 12:15 423  Saturation Ratios 10.4 - 31.8 % 7 (L) 07/15/21 12:15 7 (L)  Vitamin D, 25-Hydroxy 30 - 100 ng/mL 9.05 (L) 07/15/21 12:15 9.05 (L)  Vitamin A (Retinoic Acid) 20.1 - 62.0 ug/dL <2.5 (L) 07/15/21 12:15 <2.5 (L)  Vitamin E (Alpha Tocopherol) 7.0 - 25.1 mg/L 4.6 (L) 07/15/21 12:15 4.6 (L)  Vitamin E(Gamma Tocopherol) 0.5 - 5.5 mg/L 0.2 (L) (C) 07/15/21 12:15 0.2 (L) (C)  WBC 4.0 - 10.5 K/uL 5.1 07/15/21 12:15 5.1  RBC 3.87 - 5.11 MIL/uL 3.49 (L) 07/15/21 12:15 3.49 (L)  Hemoglobin 12.0 - 15.0 g/dL 8.4 (L) 07/15/21 12:15 8.4 (L)  HCT 36.0 - 46.0 % 28.8 (L) 07/15/21 12:15 28.8 (L)  MCV 80.0 - 100.0 fL 82.5 07/15/21 12:15 82.5  MCH 26.0 - 34.0 pg 24.1 (L) 07/15/21 12:15 24.1 (L)  MCHC 30.0 - 36.0 g/dL 29.2 (L) 07/15/21 12:15 29.2 (L)  RDW 11.5 - 15.5 % 18.7 (H) 07/15/21 12:15 18.7 (H)  Platelets 150 - 400 K/uL 164 07/15/21 12:15 164  (L): Data is abnormally low (H): Data is abnormally high (C): Corrected  HISTORY OF PRESENTING ILLNESS: Alone.  Ambulating independently.  Tamara Kelly 56 y.o.  female pleasant patient history of gastric bypass/malabsorption iron deficiency; multiple vitamin deficiencies is here for follow-up.  She reports that she continues to feel fatigued. Her bariatric specialist recently obtained labs  pertaining to nutritional deficiencies given her history of gastric bypass. She has been started on a vitamin D supplement since these labs (01/02/2022). She has been taking oral iron supplementation and tolerating it well. She reports a few episodes of bloody stools as well as bruising on her arms and gums. Started within 2 weeks. NO new medications or supplements. No known traumas. She is scheduled for a colonoscopy on 02/05/2022. No bruising on trunk. No hemoptysis or hematuria.    Review of Systems  Constitutional:  Positive for malaise/fatigue. Negative for chills, diaphoresis, fever and weight loss.  HENT:  Negative for nosebleeds and sore throat.   Eyes:  Negative for double vision.  Respiratory:  Negative for cough, hemoptysis, sputum production, shortness of breath and wheezing.   Cardiovascular:  Negative for chest pain, palpitations, orthopnea and leg swelling.  Gastrointestinal:  Negative for abdominal pain, blood in stool, constipation, diarrhea, heartburn, melena, nausea and vomiting.  Genitourinary:  Negative for dysuria, frequency and urgency.  Musculoskeletal:  Negative for back pain and joint pain.  Skin: Negative.  Negative for itching and rash.  Neurological:  Negative for dizziness, tingling, focal weakness, weakness and headaches.  Endo/Heme/Allergies:  Does not bruise/bleed easily.  Psychiatric/Behavioral:  Negative for depression. The patient is not nervous/anxious and does not have insomnia.      MEDICAL HISTORY:  Past Medical History:  Diagnosis Date   Anemia    COVID-19 08/2020   GERD (gastroesophageal  reflux disease)    Sleep apnea    Ventricular premature depolarization    Vitamin D deficiency     SURGICAL HISTORY: Past Surgical History:  Procedure Laterality Date   GASTRIC BYPASS     HERNIA REPAIR      SOCIAL HISTORY: Social History   Socioeconomic History   Marital status: Single    Spouse name: Not on file   Number of children: Not on file    Years of education: Not on file   Highest education level: Not on file  Occupational History   Not on file  Tobacco Use   Smoking status: Never   Smokeless tobacco: Never  Vaping Use   Vaping Use: Never used  Substance and Sexual Activity   Alcohol use: Not Currently    Comment: occassional   Drug use: Never   Sexual activity: Not Currently  Other Topics Concern   Not on file  Social History Narrative   Does medical billing; 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 Lives with fiance;and his son. Remote hx of smoking.    Social Determinants of Health   Financial Resource Strain: Not on file  Food Insecurity: Not on file  Transportation Needs: Not on file  Physical Activity: Not on file  Stress: Not on file  Social Connections: Not on file  Intimate Partner Violence: Not on file    FAMILY HISTORY: Family History  Problem Relation Age of Onset   Breast cancer Mother 65       breast cancer twice   Hypertension Father    Thyroid disease Father     ALLERGIES:  has No Known Allergies.  MEDICATIONS:  Current Outpatient Medications  Medication Sig Dispense Refill   acetaminophen (TYLENOL) 325 MG tablet Take by mouth.     Beta Carotene (VITAMIN A) 25000 UNIT capsule Take 25,000 Units by mouth daily. PT STATES 4X A DAY.     cholecalciferol (VITAMIN D3) 25 MCG (1000 UNIT) tablet Take 1,000 Units by mouth daily.     copper tablet Take 2 mg by mouth daily.     Ferrous Gluconate-C-Folic Acid (IRON-C PO) Take by mouth. Chewable 45mg  iron + Vit C     Multiple Vitamin (MULTI-VITAMIN) tablet Take by mouth. gummy     VITAMIN D, ERGOCALCIFEROL, PO Take by mouth. Vitamin D 5000 IU with K2 200 mcg     vitamin E 1000 UNIT capsule Take 1,000 Units by mouth daily.     vitamin k 100 MCG tablet Take 100 mcg by mouth daily. K1 120 mcg     zinc sulfate 220 (50 Zn) MG capsule Take 220 mg by mouth daily.     GAVILYTE-G 236 g solution SMARTSIG:Milliliter(s) By Mouth (Patient not  taking: Reported on 01/09/2022)     vitamin A (AQUASOL-A) 50000 units/mL injection Inject 2 mLs (100,000 Units total) into the muscle daily. (Patient not taking: Reported on 01/02/2022) 14 mL 1   No current facility-administered medications for this visit.     Marland Kitchen  PHYSICAL EXAMINATION:   Vitals:   01/09/22 1427  BP: 103/74  Pulse: 66  Resp: 18  Temp: 98.1 F (36.7 C)  SpO2: 100%    Filed Weights   01/09/22 1427  Weight: 208 lb 8 oz (94.6 kg)     Physical Exam Vitals and nursing note reviewed.  HENT:     Head: Normocephalic and atraumatic.     Mouth/Throat:     Pharynx: Oropharynx is clear.  Eyes:  Extraocular Movements: Extraocular movements intact.     Pupils: Pupils are equal, round, and reactive to light.  Cardiovascular:     Rate and Rhythm: Normal rate and regular rhythm.  Pulmonary:     Comments: Decreased breath sounds bilaterally.  Abdominal:     Palpations: Abdomen is soft.  Musculoskeletal:        General: Normal range of motion.     Cervical back: Normal range of motion.  Skin:    General: Skin is warm.  Neurological:     General: No focal deficit present.     Mental Status: She is alert and oriented to person, place, and time.  Psychiatric:        Behavior: Behavior normal.        Judgment: Judgment normal.           LABORATORY DATA:  I have reviewed the data as listed Lab Results  Component Value Date   WBC 4.7 01/09/2022   HGB 11.7 (L) 01/09/2022   HCT 35.9 (L) 01/09/2022   MCV 93.2 01/09/2022   PLT 139 (L) 01/09/2022   Recent Labs    07/15/21 1215 09/02/21 1454 01/02/22 1622  NA 139 134* 141  K 3.8 3.9 4.1  CL 106 103 107  CO2 25 26 26   GLUCOSE 101* 96 96  BUN 13 19 15   CREATININE 0.88 0.77 0.79  CALCIUM 8.7* 8.6* 9.4  GFRNONAA >60 >60 >60  PROT 7.0 7.4 7.7  ALBUMIN 4.0 4.2 4.5  AST 16 16 20   ALT 16 17 24   ALKPHOS 62 58 60  BILITOT 1.2 1.0 1.1     No results found.  ASSESSMENT & PLAN:   No  problem-specific Assessment & Plan notes found for this encounter. Symptomatic anemia #Iron deficient anemia secondary to malabsorption/gastric bypass.  March 2023- hemoglobin 8.3; iron studies consistent with iron deficiency; currently IV iron infusions.continue barimelt.   Today we reviewed her recent labortory work which showed normal iron stores and saturation levels. Mild anemia. Tolerating oral supplement well. HOLDING venofer today.   Thrombocytopenia: New. Mild. We will follow her bruising closely. RTC next week with additional labs. Discussed red flags.    #Malabsorption/celiac deficiency of vitamin D;E;A;-defer to bariatrics/ PCP    # LOW B12- MAY 2023-775; recommend continue PO B12. - see above    # DISPOSITION: RTC 1 week APP, labs        All questions were answered. The patient knows to call the clinic with any problems, questions or concerns.    Hughie Closs, PA-C 01/09/2022 4:02 PM

## 2022-01-09 NOTE — Progress Notes (Signed)
Pt states she has noticed she has developed random bruising on the inside of her left arm has been there about eleven days as well as one on her rt shoulder and not sure where they came from.

## 2022-01-12 ENCOUNTER — Encounter: Payer: Self-pay | Admitting: Gastroenterology

## 2022-01-13 MED ORDER — VITAMIN D (ERGOCALCIFEROL) 1.25 MG (50000 UNIT) PO CAPS: 50000.0000 [IU] | ORAL_CAPSULE | ORAL | 0 refills | Status: DC

## 2022-01-16 ENCOUNTER — Inpatient Hospital Stay: Payer: BC Managed Care – PPO

## 2022-01-16 ENCOUNTER — Inpatient Hospital Stay (HOSPITAL_BASED_OUTPATIENT_CLINIC_OR_DEPARTMENT_OTHER): Payer: BC Managed Care – PPO | Admitting: Nurse Practitioner

## 2022-01-16 VITALS — BP 103/82 | HR 70 | Temp 97.8°F | Resp 16 | Wt 207.5 lb

## 2022-01-16 DIAGNOSIS — D696 Thrombocytopenia, unspecified: Secondary | ICD-10-CM

## 2022-01-16 DIAGNOSIS — E538 Deficiency of other specified B group vitamins: Secondary | ICD-10-CM | POA: Diagnosis not present

## 2022-01-16 DIAGNOSIS — D508 Other iron deficiency anemias: Secondary | ICD-10-CM | POA: Diagnosis not present

## 2022-01-16 DIAGNOSIS — D649 Anemia, unspecified: Secondary | ICD-10-CM

## 2022-01-16 LAB — PROTIME-INR
INR: 1.1 (ref 0.8–1.2)
Prothrombin Time: 14 seconds (ref 11.4–15.2)

## 2022-01-16 LAB — TECHNOLOGIST SMEAR REVIEW
Plt Morphology: NORMAL
RBC MORPHOLOGY: NORMAL
WBC MORPHOLOGY: NORMAL

## 2022-01-16 LAB — CBC WITH DIFFERENTIAL/PLATELET
Abs Immature Granulocytes: 0 10*3/uL (ref 0.00–0.07)
Band Neutrophils: 0 %
Basophils Absolute: 0 10*3/uL (ref 0.0–0.1)
Basophils Relative: 0 %
Blasts: 0 %
Eosinophils Absolute: 0.1 10*3/uL (ref 0.0–0.5)
Eosinophils Relative: 2 %
HCT: 36.6 % (ref 36.0–46.0)
Hemoglobin: 11.9 g/dL — ABNORMAL LOW (ref 12.0–15.0)
Lymphocytes Relative: 36 %
Lymphs Abs: 1.6 10*3/uL (ref 0.7–4.0)
MCH: 30.4 pg (ref 26.0–34.0)
MCHC: 32.5 g/dL (ref 30.0–36.0)
MCV: 93.4 fL (ref 80.0–100.0)
Metamyelocytes Relative: 0 %
Monocytes Absolute: 0.5 10*3/uL (ref 0.1–1.0)
Monocytes Relative: 11 %
Myelocytes: 0 %
Neutro Abs: 2.3 10*3/uL (ref 1.7–7.7)
Neutrophils Relative %: 51 %
Other: 0 %
Platelets: 151 10*3/uL (ref 150–400)
Promyelocytes Relative: 0 %
RBC: 3.92 MIL/uL (ref 3.87–5.11)
RDW: 12.9 % (ref 11.5–15.5)
WBC: 4.5 10*3/uL (ref 4.0–10.5)
nRBC: 0 % (ref 0.0–0.2)
nRBC: 0 /100 WBC

## 2022-01-16 NOTE — Progress Notes (Signed)
[facility] CONSULT NOTE  Patient Care Team: Corky Downs, MD as PCP - General (Internal Medicine) Earna Coder, MD as Consulting Physician (Oncology)  CHIEF COMPLAINTS/PURPOSE OF CONSULTATION: ANEMIA   HEMATOLOGY HISTORY:  # ANEMIA- [facility] 2023- Hb-8.3; MCV-platelets- WBC; Iron sat- ferritin;  EGD/colonoscopy-[poor prep] x2 [3 years ago; [facility]]   # GASTRIC BY PASS [dec 2001; revision in 2017]; hernia repair-   #Severe vitamin a deficiency/D EK    Latest Reference Range & Units Most Recent 07/15/21 12:15  Iron 28 - 170 ug/dL 31 11/04/94 78:93 31  UIBC ug/dL 810 1/75/10 25:85 277  TIBC 250 - 450 ug/dL 824 2/35/36 14:43 154  Saturation Ratios 10.4 - 31.8 % 7 (L) 07/15/21 12:15 7 (L)  Vitamin D, 25-Hydroxy 30 - 100 ng/mL 9.05 (L) 07/15/21 12:15 9.05 (L)  Vitamin A (Retinoic Acid) 20.1 - 62.0 ug/dL <0.0 (L) 8/67/61 95:09 <2.5 (L)  Vitamin E (Alpha Tocopherol) 7.0 - 25.1 mg/L 4.6 (L) 07/15/21 12:15 4.6 (L)  Vitamin E(Gamma Tocopherol) 0.5 - 5.5 mg/L 0.2 (L) (C) 07/15/21 12:15 0.2 (L) (C)  WBC 4.0 - 10.5 K/uL 5.1 07/15/21 12:15 5.1  RBC 3.87 - 5.11 MIL/uL 3.49 (L) 07/15/21 12:15 3.49 (L)  Hemoglobin 12.0 - 15.0 g/dL 8.4 (L) 07/14/69 24:58 8.4 (L)  HCT 36.0 - 46.0 % 28.8 (L) 07/15/21 12:15 28.8 (L)  MCV 80.0 - 100.0 fL 82.5 07/15/21 12:15 82.5  MCH 26.0 - 34.0 pg 24.1 (L) 07/15/21 12:15 24.1 (L)  MCHC 30.0 - 36.0 g/dL 09.9 (L) 8/33/82 50:53 29.2 (L)  RDW 11.5 - 15.5 % 18.7 (H) 07/15/21 12:15 18.7 (H)  Platelets 150 - 400 K/uL 164 07/15/21 12:15 164  (L): Data is abnormally low (H): Data is abnormally high (C): Corrected  HISTORY OF PRESENTING ILLNESS: Alone.  Ambulating independently.  Tamara Kelly 56 y.o. female pleasant patient history of gastric bypass/malabsorption iron deficiency; multiple vitamin deficiencies is here for follow-up.  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  Patient status post IV iron infusion.  Improvement of energy levels.   Review of Systems  Constitutional:  Positive for malaise/fatigue. Negative for chills, diaphoresis, fever and weight loss.  HENT:  Negative for nosebleeds and sore throat.   Eyes:  Negative for double vision.  Respiratory:  Positive for shortness of breath. Negative for cough, hemoptysis, sputum production and wheezing.   Cardiovascular:  Negative for chest pain, palpitations, orthopnea and leg swelling.  Gastrointestinal:  Negative for abdominal pain, blood in stool, constipation, diarrhea, heartburn, melena, nausea and vomiting.  Genitourinary:  Negative for dysuria, frequency and urgency.  Musculoskeletal:  Negative for back pain and joint pain.  Skin: Negative.  Negative for itching and rash.  Neurological:  Positive for tingling and weakness. Negative for dizziness, focal weakness and headaches.  Endo/Heme/Allergies:  Does not bruise/bleed easily.  Psychiatric/Behavioral:  Negative for depression. The patient is not nervous/anxious and does not have insomnia.     MEDICAL HISTORY:  Past Medical History:  Diagnosis Date   Anemia    COVID-19 08/2020   GERD (gastroesophageal reflux disease)    Sleep apnea    Ventricular premature depolarization    Vitamin D deficiency     SURGICAL HISTORY: Past Surgical History:  Procedure Laterality Date   GASTRIC BYPASS     HERNIA REPAIR      SOCIAL HISTORY: Social History   Socioeconomic History   Marital status: Single  Spouse name: Not on file   Number of children: Not on file   Years of education: Not on file   Highest education level: Not on file  Occupational History   Not on file  Tobacco Use   Smoking status: Never   Smokeless tobacco: Never  Vaping Use   Vaping Use: Never used  Substance and Sexual Activity   Alcohol use: Not Currently    Comment: occassional   Drug use: Never   Sexual activity: Not Currently  Other Topics Concern    Not on file  Social History Narrative   Does medical billing; 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 Lives with fiance;and his son. Remote hx of smoking.    Social Determinants of Health   Financial Resource Strain: Not on file  Food Insecurity: Not on file  Transportation Needs: Not on file  Physical Activity: Not on file  Stress: Not on file  Social Connections: Not on file  Intimate Partner Violence: Not on file    FAMILY HISTORY: Family History  Problem Relation Age of Onset   Breast cancer Mother 29       breast cancer twice   Hypertension Father    Thyroid disease Father     ALLERGIES:  has No Known Allergies.   MEDICATIONS:  Current Outpatient Medications  Medication Sig Dispense Refill   acetaminophen (TYLENOL) 325 MG tablet Take by mouth.     Beta Carotene (VITAMIN A) 25000 UNIT capsule Take 25,000 Units by mouth daily. PT STATES 4X A DAY.     cholecalciferol (VITAMIN D3) 25 MCG (1000 UNIT) tablet Take 1,000 Units by mouth daily.     copper tablet Take 2 mg by mouth daily.     Ferrous Gluconate-C-Folic Acid (IRON-C PO) Take by mouth. Chewable 45mg  iron + Vit C     GAVILYTE-G 236 g solution SMARTSIG:Milliliter(s) By Mouth (Patient not taking: Reported on 01/09/2022)     Multiple Vitamin (MULTI-VITAMIN) tablet Take by mouth. gummy     vitamin A (AQUASOL-A) 50000 units/mL injection Inject 2 mLs (100,000 Units total) into the muscle daily. (Patient not taking: Reported on 01/02/2022) 14 mL 1   Vitamin D, Ergocalciferol, (DRISDOL) 1.25 MG (50000 UNIT) CAPS capsule Take 1 capsule (50,000 Units total) by mouth every 7 (seven) days. 12 capsule 0   vitamin E 1000 UNIT capsule Take 1,000 Units by mouth daily.     vitamin k 100 MCG tablet Take 100 mcg by mouth daily. K1 120 mcg     zinc sulfate 220 (50 Zn) MG capsule Take 220 mg by mouth daily.     No current facility-administered medications for this visit.   Marland Kitchen  PHYSICAL EXAMINATION: There were no vitals  filed for this visit.  Filed Weights   01/16/22 1421  Weight: 207 lb 8 oz (94.1 kg)    Physical Exam Vitals and nursing note reviewed.  HENT:     Head: Normocephalic and atraumatic.     Mouth/Throat:     Pharynx: Oropharynx is clear.  Eyes:     Extraocular Movements: Extraocular movements intact.     Pupils: Pupils are equal, round, and reactive to light.  Cardiovascular:     Rate and Rhythm: Normal rate and regular rhythm.  Pulmonary:     Comments: Decreased breath sounds bilaterally.  Abdominal:     Palpations: Abdomen is soft.  Musculoskeletal:        General: Normal range of motion.     Cervical  back: Normal range of motion.  Skin:    General: Skin is warm.  Neurological:     General: No focal deficit present.     Mental Status: She is alert and oriented to person, place, and time.  Psychiatric:        Behavior: Behavior normal.        Judgment: Judgment normal.      LABORATORY DATA:  I have reviewed the data as listed Lab Results  Component Value Date   WBC 4.5 01/16/2022   HGB 11.9 (L) 01/16/2022   HCT 36.6 01/16/2022   MCV 93.4 01/16/2022   PLT 151 01/16/2022   Recent Labs    07/15/21 1215 09/02/21 1454 01/02/22 1622  NA 139 134* 141  K 3.8 3.9 4.1  CL 106 103 107  CO2 25 26 26   GLUCOSE 101* 96 96  BUN 13 19 15   CREATININE 0.88 0.77 0.79  CALCIUM 8.7* 8.6* 9.4  GFRNONAA >60 >60 >60  PROT 7.0 7.4 7.7  ALBUMIN 4.0 4.2 4.5  AST 16 16 20   ALT 16 17 24   ALKPHOS 62 58 60  BILITOT 1.2 1.0 1.1      No results found.  ASSESSMENT & PLAN:   No problem-specific Assessment & Plan notes found for this encounter.  Iron Deficiency Anemia- secondary to malabsorption in setting of gastric bypass. March 2023 hmg 8.3. Iron studies were consistent with iron deficiency.  Now status post Venofer x 6, last 11/07/2021.Hemoglobin has improved to 11.9. Microcytosis has resolved. Ferritin and iron sat had normalized on 01/02/22 bloodwork with Dr. . Hold IV  iron for now. Monitor.   Thrombocytopenia- today is 1 week follow up for abnormal bruising and mild thrombocytopenia. Now resolved. PT/INR and smear pending. No additional work up at this time. Monitor.   B12 Deficiency- low b12. May 2023 imrpoved to 775. Continue oral b12.   Disposition:  3 mo- lab (cbc, cmp, ferritin, iron studies, b12) Day to week later see Dr 11/09/2021 or APP, +/- venofer- la   All questions were answered. The patient knows to call the clinic with any problems, questions or concerns.    01/04/22, NP 01/16/2022 2:38 PM

## 2022-02-03 ENCOUNTER — Ambulatory Visit: Payer: BC Managed Care – PPO | Admitting: Anesthesiology

## 2022-02-03 ENCOUNTER — Other Ambulatory Visit: Payer: Self-pay

## 2022-02-03 ENCOUNTER — Ambulatory Visit
Admission: RE | Admit: 2022-02-03 | Discharge: 2022-02-03 | Disposition: A | Discharge status: Home / Self Care | Payer: BC Managed Care – PPO | Attending: Gastroenterology | Admitting: Gastroenterology

## 2022-02-03 ENCOUNTER — Encounter
Admission: RE | Disposition: A | Discharge status: Home / Self Care | Payer: Self-pay | Source: Home / Self Care | Attending: Gastroenterology

## 2022-02-03 DIAGNOSIS — Z86010 Personal history of colonic polyps: Secondary | ICD-10-CM | POA: Diagnosis present

## 2022-02-03 DIAGNOSIS — Z1211 Encounter for screening for malignant neoplasm of colon: Secondary | ICD-10-CM | POA: Diagnosis not present

## 2022-02-03 DIAGNOSIS — G4730 Sleep apnea, unspecified: Secondary | ICD-10-CM | POA: Diagnosis not present

## 2022-02-03 HISTORY — PX: COLONOSCOPY WITH PROPOFOL: SHX5780

## 2022-02-03 SURGERY — COLONOSCOPY WITH PROPOFOL
Anesthesia: General

## 2022-02-03 MED ORDER — GOLYTELY 236 G PO SOLR: 4000.0000 mL | Freq: Once | ORAL | 0 refills | Status: AC

## 2022-02-03 MED ORDER — SODIUM CHLORIDE 0.9 % IV SOLN
INTRAVENOUS | Status: DC
  Administered 2022-02-03: 20 mL/h via INTRAVENOUS

## 2022-02-03 MED ORDER — PROPOFOL 500 MG/50ML IV EMUL
INTRAVENOUS | Status: DC | PRN
  Administered 2022-02-03: 150 ug/kg/min via INTRAVENOUS

## 2022-02-03 MED ORDER — PROPOFOL 10 MG/ML IV BOLUS
INTRAVENOUS | Status: DC | PRN
  Administered 2022-02-03: 70 mg via INTRAVENOUS

## 2022-02-03 NOTE — Transfer of Care (Signed)
Immediate Anesthesia Transfer of Care Note  Patient: Tamara Kelly  Procedure(s) Performed: COLONOSCOPY WITH PROPOFOL  Patient Location: PACU  Anesthesia Type:General  Level of Consciousness: awake, alert  and oriented  Airway & Oxygen Therapy: Patient Spontanous Breathing  Post-op Assessment: Report given to RN and Post -op Vital signs reviewed and stable  Post vital signs: Reviewed and stable  Last Vitals:  Vitals Value Taken Time  BP 121/73 02/03/22 0915  Temp 36.1 C 02/03/22 0914  Pulse 69 02/03/22 0914  Resp 13 02/03/22 0916  SpO2 96 % 02/03/22 0914  Vitals shown include unvalidated device data.  Last Pain:  Vitals:   02/03/22 0914  TempSrc: Temporal  PainSc: Asleep         Complications: No notable events documented.

## 2022-02-03 NOTE — Anesthesia Postprocedure Evaluation (Signed)
Anesthesia Post Note  Patient: Tamara Kelly  Procedure(s) Performed: COLONOSCOPY WITH PROPOFOL  Patient location during evaluation: PACU Anesthesia Type: General Level of consciousness: awake and oriented Pain management: pain level controlled Vital Signs Assessment: post-procedure vital signs reviewed and stable Respiratory status: spontaneous breathing and respiratory function stable Cardiovascular status: blood pressure returned to baseline Anesthetic complications: no   No notable events documented.   Last Vitals:  Vitals:   02/03/22 0914 02/03/22 0924  BP: 121/73   Pulse: 69 69  Resp: 20   Temp: (!) 36.1 C   SpO2: 96% 99%    Last Pain:  Vitals:   02/03/22 0924  TempSrc:   PainSc: 0-No pain                 VAN STAVEREN,Perri Aragones

## 2022-02-03 NOTE — Anesthesia Preprocedure Evaluation (Signed)
Anesthesia Evaluation  Patient identified by MRN, date of birth, ID band Patient awake    Reviewed: Allergy & Precautions, NPO status , Patient's Chart, lab work & pertinent test results  Airway Mallampati: II  TM Distance: >3 FB Neck ROM: full    Dental  (+) Teeth Intact, Dental Advisory Given   Pulmonary neg pulmonary ROS, sleep apnea ,    Pulmonary exam normal breath sounds clear to auscultation       Cardiovascular Exercise Tolerance: Good negative cardio ROS Normal cardiovascular exam+ dysrhythmias  Rhythm:Regular     Neuro/Psych negative neurological ROS  negative psych ROS   GI/Hepatic negative GI ROS, Neg liver ROS, GERD  ,  Endo/Other  negative endocrine ROS  Renal/GU negative Renal ROS  negative genitourinary   Musculoskeletal   Abdominal (+) + obese,   Peds negative pediatric ROS (+)  Hematology negative hematology ROS (+) Blood dyscrasia, anemia ,   Anesthesia Other Findings Past Medical History: No date: Anemia 08/2020: COVID-19 No date: GERD (gastroesophageal reflux disease) No date: Sleep apnea No date: Ventricular premature depolarization No date: Vitamin D deficiency  Past Surgical History: No date: GASTRIC BYPASS No date: HERNIA REPAIR  BMI    Body Mass Index: 33.73 kg/m      Reproductive/Obstetrics negative OB ROS                             Anesthesia Physical Anesthesia Plan  ASA: 2  Anesthesia Plan: General   Post-op Pain Management:    Induction: Intravenous  PONV Risk Score and Plan: Propofol infusion and TIVA  Airway Management Planned: Natural Airway  Additional Equipment:   Intra-op Plan:   Post-operative Plan:   Informed Consent: I have reviewed the patients History and Physical, chart, labs and discussed the procedure including the risks, benefits and alternatives for the proposed anesthesia with the patient or authorized  representative who has indicated his/her understanding and acceptance.     Dental Advisory Given  Plan Discussed with: CRNA and Surgeon  Anesthesia Plan Comments:         Anesthesia Quick Evaluation

## 2022-02-03 NOTE — Progress Notes (Signed)
Per Dr. Marius Ditch poor prep today, she is coming back tomorrow, please send in [place], thanks Sent to pharmacy

## 2022-02-03 NOTE — H&P (Signed)
Arlyss Repress, MD [address]  [address]  [place], [place] [address]  Main: [phone]  Fax: [phone] Pager: [phone]  Primary Care Physician:  Corky Downs, MD Primary Gastroenterologist:  Dr. Arlyss Repress  Pre-Procedure History & Physical: HPI:  Tamara Kelly is a 56 y.o. female is here for an colonoscopy.   Past Medical History:  Diagnosis Date   Anemia    COVID-19 08/2020   GERD (gastroesophageal reflux disease)    Sleep apnea    Ventricular premature depolarization    Vitamin D deficiency     Past Surgical History:  Procedure Laterality Date   GASTRIC BYPASS     HERNIA REPAIR      Prior to Admission medications   Medication Sig Start Date End Date Taking? Authorizing Provider  acetaminophen (TYLENOL) 325 MG tablet Take by mouth. 11/22/17  Yes [provider]  Beta Carotene (VITAMIN A) 25000 UNIT capsule Take 25,000 Units by mouth daily. PT STATES 4X A DAY.   Yes [provider]  cholecalciferol (VITAMIN D3) 25 MCG (1000 UNIT) tablet Take 1,000 Units by mouth daily.   Yes [provider]  copper tablet Take 2 mg by mouth daily.   Yes [provider]  Ferrous Gluconate-C-Folic Acid (IRON-C PO) Take by mouth. Chewable 45mg  iron + Vit C   Yes [provider]  Multiple Vitamin (MULTI-VITAMIN) tablet Take by mouth. gummy   Yes [provider]  Vitamin D, Ergocalciferol, (DRISDOL) 1.25 MG (50000 UNIT) CAPS capsule Take 1 capsule (50,000 Units total) by mouth every 7 (seven) days. 01/13/22  Yes Sariah Henkin, 01/15/22, MD  vitamin E 1000 UNIT capsule Take 1,000 Units by mouth daily.   Yes [provider]  vitamin k 100 MCG tablet Take 100 mcg by mouth daily. K1 120 mcg   Yes [provider]  zinc sulfate 220 (50 Zn) MG capsule Take 220 mg by mouth daily.   Yes [provider]  GAVILYTE-G 236 g solution SMARTSIG:Milliliter(s) By Mouth Patient not taking: Reported on 01/09/2022 01/02/22    [provider]  vitamin A (AQUASOL-A) 50000 units/mL injection Inject 2 mLs (100,000 Units total) into the muscle daily. Patient not taking: Reported on 01/02/2022 10/02/21   10/04/21, MD    Allergies as of 01/02/2022   (No Known Allergies)    Family History  Problem Relation Age of Onset   Breast cancer Mother 42       breast cancer twice   Hypertension Father    Thyroid disease Father     Social History   Socioeconomic History   Marital status: Single    Spouse name: Not on file   Number of children: Not on file   Years of education: Not on file   Highest education level: Not on file  Occupational History   Not on file  Tobacco Use   Smoking status: Never   Smokeless tobacco: Never  Vaping Use   Vaping Use: Never used  Substance and Sexual Activity   Alcohol use: Not Currently    Comment: occassional   Drug use: Never   Sexual activity: Not Currently  Other Topics Concern   Not on file  Social History Narrative   Does medical billing; 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 Lives with fiance;and his son. Remote hx of smoking.    Social Determinants of Health   Financial Resource Strain: Not on file  Food Insecurity: Not on file  Transportation  Needs: Not on file  Physical Activity: Not on file  Stress: Not on file  Social Connections: Not on file  Intimate Partner Violence: Not on file    Review of Systems: See HPI, otherwise negative ROS  Physical Exam: BP 130/79   Pulse (!) 53   Temp (!) 96.8 F (36 C) (Temporal)   Resp 20   Ht 5\' 6"  (1.676 m)   Wt 94.8 kg   SpO2 96%   BMI 33.73 kg/m  General:   Alert,  pleasant and cooperative in NAD Head:  Normocephalic and atraumatic. Neck:  Supple; no masses or thyromegaly. Lungs:  Clear throughout to auscultation.    Heart:  Regular rate and rhythm. Abdomen:  Soft, nontender and nondistended. Normal bowel sounds, without guarding, and without rebound.   Neurologic:  Alert and   oriented x4;  grossly normal neurologically.  Impression/Plan: Tamara Kelly is here for an colonoscopy to be performed for History of colon polyps  Risks, benefits, limitations, and alternatives regarding  colonoscopy have been reviewed with the patient.  Questions have been answered.  All parties agreeable.   Sherri Sear, MD  02/03/2022, 8:27 AM

## 2022-02-03 NOTE — Op Note (Addendum)
[facility] Gastroenterology Patient Name: Tamara Kelly Procedure Date: 02/03/2022 8:35 AM MRN: [id number] Account #: [id number] Date of Birth: 1966-03-13 Admit Type: Outpatient Age: 56 Room: [facility] ENDO ROOM 4 Gender: Female Note Status: Finalized Instrument Name: Park Meo [id number] Procedure:             Colonoscopy Indications:           Surveillance: History of adenomatous polyps,                         inadequate prep on last exam (<12yr) Providers:             Lin Landsman MD, MD Referring MD:          Cletis Athens, MD (Referring MD) Medicines:             General Anesthesia Complications:         No immediate complications. Estimated blood loss: None. Procedure:             Pre-Anesthesia Assessment:                        - Prior to the procedure, a History and Physical was                         performed, and patient medications and allergies were                         reviewed. The patient is competent. The risks and                         benefits of the procedure and the sedation options and                         risks were discussed with the patient. All questions                         were answered and informed consent was obtained.                         Patient identification and proposed procedure were                         verified by the physician, the nurse, the                         anesthesiologist, the anesthetist and the technician                         in the pre-procedure area in the procedure room in the                         endoscopy suite. Mental Status Examination: alert and                         oriented. Airway Examination: normal oropharyngeal                         airway and neck mobility. Respiratory Examination:  clear to auscultation. CV Examination: normal.                         Prophylactic Antibiotics: The patient does not require                         prophylactic  antibiotics. Prior Anticoagulants: The                         patient has taken no previous anticoagulant or                         antiplatelet agents. ASA Grade Assessment: III - A                         patient with severe systemic disease. After reviewing                         the risks and benefits, the patient was deemed in                         satisfactory condition to undergo the procedure. The                         anesthesia plan was to use general anesthesia.                         Immediately prior to administration of medications,                         the patient was re-assessed for adequacy to receive                         sedatives. The heart rate, respiratory rate, oxygen                         saturations, blood pressure, adequacy of pulmonary                         ventilation, and response to care were monitored                         throughout the procedure. The physical status of the                         patient was re-assessed after the procedure.                        After obtaining informed consent, the colonoscope was                         passed under direct vision. Throughout the procedure,                         the patient's blood pressure, pulse, and oxygen                         saturations were monitored continuously. The  Colonoscope was introduced through the anus and                         advanced to the the transverse colon. The colonoscopy                         was extremely difficult due to inadequate bowel prep,                         significant looping and the patient's body habitus.                         Successful completion of the procedure was aided by                         changing the patient to a supine position and applying                         abdominal pressure. The patient tolerated the                         procedure well. The quality of the bowel preparation                          was poor. Findings:      The perianal and digital rectal examinations were normal. Pertinent       negatives include normal sphincter tone and no palpable rectal lesions.      Copious quantities of semi-liquid stool was found in the entire colon,       precluding visualization.      Mucosal barotraum in the left colon resulted in oozing of blood, mixed       with liquid stool notced during withdrawal of scope      The retroflexed view of the distal rectum and anal verge was normal and       showed no anal or rectal abnormalities. Impression:            - Preparation of the colon was poor.                        - Stool in the entire examined colon.                        - The distal rectum and anal verge are normal on                         retroflexion view.                        - No specimens collected. Recommendation:        - Discharge patient to home (with escort).                        - Clear liquid diet today.                        - Repeat colonoscopy tomorrow because the bowel  preparation was suboptimal. Procedure Code(s):     --- Professional ---                        F1638, 53, Colorectal cancer screening; colonoscopy on                         individual at high risk Diagnosis Code(s):     --- Professional ---                        Z86.010, Personal history of colonic polyps CPT copyright 2019 [employer] Medical [employer]. All rights reserved. The codes documented in this report are preliminary and upon coder review may  be revised to meet current compliance requirements. Dr. Libby Maw Toney Reil MD, MD 02/03/2022 9:16:09 AM This report has been signed electronically. Number of Addenda: 0 Note Initiated On: 02/03/2022 8:35 AM Scope Withdrawal Time: 0 hours 6 minutes 33 seconds  Total Procedure Duration: 0 hours 28 minutes 45 seconds  Estimated Blood Loss:  Estimated blood loss: none.      [facility]

## 2022-02-04 ENCOUNTER — Ambulatory Visit
Admission: RE | Admit: 2022-02-04 | Discharge: 2022-02-04 | Disposition: A | Discharge status: Home / Self Care | Payer: BC Managed Care – PPO | Attending: Gastroenterology | Admitting: Gastroenterology

## 2022-02-04 ENCOUNTER — Encounter
Admission: RE | Disposition: A | Discharge status: Home / Self Care | Payer: Self-pay | Source: Home / Self Care | Attending: Gastroenterology

## 2022-02-04 ENCOUNTER — Encounter: Payer: Self-pay | Admitting: Gastroenterology

## 2022-02-04 ENCOUNTER — Ambulatory Visit: Payer: BC Managed Care – PPO | Admitting: Certified Registered"

## 2022-02-04 DIAGNOSIS — K219 Gastro-esophageal reflux disease without esophagitis: Secondary | ICD-10-CM | POA: Diagnosis not present

## 2022-02-04 DIAGNOSIS — M1990 Unspecified osteoarthritis, unspecified site: Secondary | ICD-10-CM | POA: Diagnosis not present

## 2022-02-04 DIAGNOSIS — E669 Obesity, unspecified: Secondary | ICD-10-CM | POA: Diagnosis not present

## 2022-02-04 DIAGNOSIS — G4730 Sleep apnea, unspecified: Secondary | ICD-10-CM | POA: Diagnosis not present

## 2022-02-04 DIAGNOSIS — Z1211 Encounter for screening for malignant neoplasm of colon: Secondary | ICD-10-CM | POA: Insufficient documentation

## 2022-02-04 DIAGNOSIS — Z6833 Body mass index (BMI) 33.0-33.9, adult: Secondary | ICD-10-CM | POA: Diagnosis not present

## 2022-02-04 DIAGNOSIS — Z9884 Bariatric surgery status: Secondary | ICD-10-CM | POA: Insufficient documentation

## 2022-02-04 DIAGNOSIS — Z86010 Personal history of colonic polyps: Secondary | ICD-10-CM | POA: Diagnosis not present

## 2022-02-04 DIAGNOSIS — Z860101 Personal history of adenomatous and serrated colon polyps: Secondary | ICD-10-CM

## 2022-02-04 HISTORY — PX: COLONOSCOPY: SHX5424

## 2022-02-04 SURGERY — COLONOSCOPY
Anesthesia: General

## 2022-02-04 MED ORDER — PROPOFOL 10 MG/ML IV BOLUS
INTRAVENOUS | Status: DC | PRN
  Administered 2022-02-04: 30 mg via INTRAVENOUS
  Administered 2022-02-04: 60 mg via INTRAVENOUS

## 2022-02-04 MED ORDER — LIDOCAINE HCL (CARDIAC) PF 100 MG/5ML IV SOSY
PREFILLED_SYRINGE | INTRAVENOUS | Status: DC | PRN
  Administered 2022-02-04: 50 mg via INTRAVENOUS

## 2022-02-04 MED ORDER — GLYCOPYRROLATE 0.2 MG/ML IJ SOLN
INTRAMUSCULAR | Status: DC | PRN
  Administered 2022-02-04 (×2): .1 mg via INTRAVENOUS

## 2022-02-04 MED ORDER — SODIUM CHLORIDE 0.9 % IV SOLN
INTRAVENOUS | Status: DC
  Administered 2022-02-04: 1000 mL via INTRAVENOUS

## 2022-02-04 MED ORDER — PROPOFOL 10 MG/ML IV BOLUS
INTRAVENOUS | Status: AC
  Filled 2022-02-04: qty 20

## 2022-02-04 MED ORDER — PROPOFOL 500 MG/50ML IV EMUL
INTRAVENOUS | Status: DC | PRN
  Administered 2022-02-04: 175 ug/kg/min via INTRAVENOUS

## 2022-02-04 NOTE — Progress Notes (Signed)
Reported to me to make sure patient sees her cardiologist as an outpatient due to lower heart rate. Pt. Made aware and agreed.

## 2022-02-04 NOTE — Anesthesia Procedure Notes (Signed)
Procedure Name: MAC Date/Time: 02/04/2022 11:30 AM  Performed by: Jerrye Noble, CRNAPre-anesthesia Checklist: Patient identified, Emergency Drugs available, Patient being monitored, Suction available and Timeout performed Patient Re-evaluated:Patient Re-evaluated prior to induction Oxygen Delivery Method: Nasal cannula

## 2022-02-04 NOTE — Anesthesia Preprocedure Evaluation (Addendum)
Anesthesia Evaluation  Patient identified by MRN, date of birth, ID band Patient awake    Reviewed: Allergy & Precautions, NPO status , Patient's Chart, lab work & pertinent test results  History of Anesthesia Complications (+) PONV and history of anesthetic complications  Airway Mallampati: II   Neck ROM: Full    Dental  (+) Missing   Pulmonary sleep apnea ,    Pulmonary exam normal breath sounds clear to auscultation       Cardiovascular Normal cardiovascular exam Rhythm:Regular Rate:Normal  PVCs   Neuro/Psych negative neurological ROS     GI/Hepatic GERD  ,S/p gastric bypass   Endo/Other  Obesity   Renal/GU negative Renal ROS     Musculoskeletal  (+) Arthritis ,   Abdominal   Peds  Hematology  (+) Blood dyscrasia, anemia ,   Anesthesia Other Findings   Reproductive/Obstetrics                            Anesthesia Physical Anesthesia Plan  ASA: 2  Anesthesia Plan: General   Post-op Pain Management:    Induction: Intravenous  PONV Risk Score and Plan: 3 and Propofol infusion, TIVA and Treatment may vary due to age or medical condition  Airway Management Planned: Natural Airway  Additional Equipment:   Intra-op Plan:   Post-operative Plan:   Informed Consent: I have reviewed the patients History and Physical, chart, labs and discussed the procedure including the risks, benefits and alternatives for the proposed anesthesia with the patient or authorized representative who has indicated his/her understanding and acceptance.       Plan Discussed with: CRNA  Anesthesia Plan Comments: (LMA/GETA backup discussed.  Patient consented for risks of anesthesia including but not limited to:  - adverse reactions to medications - damage to eyes, teeth, lips or other oral mucosa - nerve damage due to positioning  - sore throat or hoarseness - damage to heart, brain, nerves,  lungs, other parts of body or loss of life  Informed patient about role of CRNA in peri- and intra-operative care.  Patient voiced understanding.)        Anesthesia Quick Evaluation

## 2022-02-04 NOTE — H&P (Signed)
Cephas Darby, MD [address]  [place]  [place], [place] [address]  Main: [phone]  Fax: [phone] Pager: [phone]  Primary Care Physician:  Cletis Athens, MD Primary Gastroenterologist:  Dr. Cephas Darby  Pre-Procedure History & Physical: HPI:  Tamara Kelly is a 56 y.o. female is here for an colonoscopy.   Past Medical History:  Diagnosis Date   Anemia    COVID-19 08/2020   GERD (gastroesophageal reflux disease)    Sleep apnea    Ventricular premature depolarization    Vitamin D deficiency     Past Surgical History:  Procedure Laterality Date   COLONOSCOPY WITH PROPOFOL N/A 02/03/2022   Procedure: COLONOSCOPY WITH PROPOFOL;  Surgeon: Lin Landsman, MD;  Location: [facility] ENDOSCOPY;  Service: Gastroenterology;  Laterality: N/A;   GASTRIC BYPASS     HERNIA REPAIR      Prior to Admission medications   Medication Sig Start Date End Date Taking? Authorizing Provider  Beta Carotene (VITAMIN A) 25000 UNIT capsule Take 25,000 Units by mouth daily. PT STATES 4X A DAY.   Yes [provider]  cholecalciferol (VITAMIN D3) 25 MCG (1000 UNIT) tablet Take 1,000 Units by mouth daily.   Yes [provider]  copper tablet Take 2 mg by mouth daily.   Yes [provider]  Ferrous Gluconate-C-Folic Acid (IRON-C PO) Take by mouth. Chewable 45mg  iron + Vit C   Yes [provider]  GAVILYTE-G 236 g solution  01/02/22  Yes [provider]  Multiple Vitamin (MULTI-VITAMIN) tablet Take by mouth. gummy   Yes [provider]  Vitamin D, Ergocalciferol, (DRISDOL) 1.25 MG (50000 UNIT) CAPS capsule Take 1 capsule (50,000 Units total) by mouth every 7 (seven) days. 01/13/22  Yes Suhaan Perleberg, Tally Due, MD  vitamin E 1000 UNIT capsule Take 1,000 Units by mouth daily.   Yes [provider]  vitamin k 100 MCG tablet Take 100 mcg by mouth daily. K1 120 mcg   Yes [provider]  zinc sulfate 220 (50 Zn) MG capsule  Take 220 mg by mouth daily.   Yes [provider]  acetaminophen (TYLENOL) 325 MG tablet Take by mouth. 11/22/17   [provider]    Allergies as of 02/03/2022   (No Known Allergies)    Family History  Problem Relation Age of Onset   Breast cancer Mother 55       breast cancer twice   Hypertension Father    Thyroid disease Father     Social History   Socioeconomic History   Marital status: Single    Spouse name: Not on file   Number of children: Not on file   Years of education: Not on file   Highest education level: Not on file  Occupational History   Not on file  Tobacco Use   Smoking status: Never   Smokeless tobacco: Never  Vaping Use   Vaping Use: Never used  Substance and Sexual Activity   Alcohol use: Not Currently    Comment: occassional   Drug use: Never   Sexual activity: Not Currently  Other Topics Concern   Not on file  Social History Narrative   Does medical billing; 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 Lives with fiance;and his son. Remote hx of smoking.    Social Determinants of Health   Financial Resource Strain: Not on file  Food Insecurity: Not on file  Transportation Needs: Not on file  Physical Activity: Not  on file  Stress: Not on file  Social Connections: Not on file  Intimate Partner Violence: Not on file    Review of Systems: See HPI, otherwise negative ROS  Physical Exam: BP (!) 152/83   Pulse (!) 50   Temp 97.9 F (36.6 C) (Temporal)   Resp 18   Ht 5\' 6"  (1.676 m)   Wt 94.8 kg   SpO2 97%   BMI 33.73 kg/m  General:   Alert,  pleasant and cooperative in NAD Head:  Normocephalic and atraumatic. Neck:  Supple; no masses or thyromegaly. Lungs:  Clear throughout to auscultation.    Heart:  Regular rate and rhythm. Abdomen:  Soft, nontender and nondistended. Normal bowel sounds, without guarding, and without rebound.   Neurologic:  Alert and  oriented x4;  grossly normal  neurologically.  Impression/Plan: Tamara Kelly is here for an colonoscopy to be performed for h/o colon polyps  Risks, benefits, limitations, and alternatives regarding  colonoscopy have been reviewed with the patient.  Questions have been answered.  All parties agreeable.   Sherri Sear, MD  02/04/2022, 10:24 AM

## 2022-02-04 NOTE — Transfer of Care (Signed)
Immediate Anesthesia Transfer of Care Note  Patient: Tamara Kelly  Procedure(s) Performed: COLONOSCOPY  Patient Location: PACU and Endoscopy Unit  Anesthesia Type:General  Level of Consciousness: drowsy  Airway & Oxygen Therapy: Patient Spontanous Breathing  Post-op Assessment: Report given to RN  Post vital signs: Reviewed and stable  Last Vitals:  Vitals Value Taken Time  BP 116/69 02/04/22 1217  Temp 35.8 C 02/04/22 1217  Pulse 91 02/04/22 1217  Resp 15 02/04/22 1217  SpO2 98 % 02/04/22 1217    Last Pain:  Vitals:   02/04/22 1217  TempSrc:   PainSc: Asleep         Complications: No notable events documented.

## 2022-02-04 NOTE — Op Note (Signed)
[facility] Gastroenterology Patient Name: Tamara Kelly Procedure Date: 02/04/2022 11:20 AM MRN: [id number] Account #: [id number] Date of Birth: [birth year] Admit Type: Outpatient Age: 56 Room: [facility] ENDO ROOM 3 Gender: Female Note Status: Finalized Instrument Name: Jasper Riling [id number] Procedure:             Colonoscopy Indications:           Surveillance: Personal history of adenomatous polyps                         on last colonoscopy > 3 years ago Providers:             Lin Landsman MD, MD Referring MD:          Cletis Athens, MD (Referring MD) Medicines:             General Anesthesia Complications:         No immediate complications. Estimated blood loss: None. Procedure:             Pre-Anesthesia Assessment:                        - Prior to the procedure, a History and Physical was                         performed, and patient medications and allergies were                         reviewed. The patient is competent. The risks and                         benefits of the procedure and the sedation options and                         risks were discussed with the patient. All questions                         were answered and informed consent was obtained.                         Patient identification and proposed procedure were                         verified by the physician, the nurse, the                         anesthesiologist, the anesthetist and the technician                         in the pre-procedure area in the procedure room in the                         endoscopy suite. Mental Status Examination: alert and                         oriented. Airway Examination: normal oropharyngeal                         airway and neck mobility. Respiratory Examination:  clear to auscultation. CV Examination: normal.                         Prophylactic Antibiotics: The patient does not require                          prophylactic antibiotics. Prior Anticoagulants: The                         patient has taken no previous anticoagulant or                         antiplatelet agents. ASA Grade Assessment: III - A                         patient with severe systemic disease. After reviewing                         the risks and benefits, the patient was deemed in                         satisfactory condition to undergo the procedure. The                         anesthesia plan was to use general anesthesia.                         Immediately prior to administration of medications,                         the patient was re-assessed for adequacy to receive                         sedatives. The heart rate, respiratory rate, oxygen                         saturations, blood pressure, adequacy of pulmonary                         ventilation, and response to care were monitored                         throughout the procedure. The physical status of the                         patient was re-assessed after the procedure.                        After obtaining informed consent, the colonoscope was                         passed under direct vision. Throughout the procedure,                         the patient's blood pressure, pulse, and oxygen                         saturations were monitored continuously. The  Colonoscope was introduced through the anus and                         advanced to the the cecum, identified by appendiceal                         orifice and ileocecal valve. The colonoscopy was                         extremely difficult due to significant looping and the                         patient's body habitus. Successful completion of the                         procedure was aided by changing the patient to a prone                         position and applying abdominal pressure. The patient                         tolerated the procedure well. The quality of the  bowel                         preparation was adequate to identify polyps 6 mm and                         larger in size. Findings:      The perianal and digital rectal examinations were normal. Pertinent       negatives include normal sphincter tone and no palpable rectal lesions.      The entire examined colon appeared normal.      The retroflexed view of the distal rectum and anal verge was normal and       showed no anal or rectal abnormalities. Impression:            - The entire examined colon is normal.                        - The distal rectum and anal verge are normal on                         retroflexion view.                        - No specimens collected. Recommendation:        - Discharge patient to home (with escort).                        - Resume previous diet today.                        - Continue present medications.                        - Repeat colonoscopy in 10 years for screening                         purposes. Procedure Code(s):     ---  Professional ---                        A6301, Colorectal cancer screening; colonoscopy on                         individual at high risk Diagnosis Code(s):     --- Professional ---                        Z86.010, Personal history of colonic polyps CPT copyright 2019 American Medical Association. All rights reserved. The codes documented in this report are preliminary and upon coder review may  be revised to meet current compliance requirements. Dr. Ulyess Mort Lin Landsman MD, MD 02/04/2022 12:16:22 PM This report has been signed electronically. Number of Addenda: 0 Note Initiated On: 02/04/2022 11:20 AM Scope Withdrawal Time: 0 hours 10 minutes 27 seconds  Total Procedure Duration: 0 hours 28 minutes 8 seconds  Estimated Blood Loss:  Estimated blood loss: none.      [facility]

## 2022-02-04 NOTE — Anesthesia Postprocedure Evaluation (Signed)
Anesthesia Post Note  Patient: Tamara Kelly  Procedure(s) Performed: COLONOSCOPY  Patient location during evaluation: PACU Anesthesia Type: General Level of consciousness: awake and alert, oriented and patient cooperative Pain management: pain level controlled Vital Signs Assessment: post-procedure vital signs reviewed and stable Respiratory status: spontaneous breathing, nonlabored ventilation and respiratory function stable Cardiovascular status: blood pressure returned to baseline and stable Postop Assessment: adequate PO intake Anesthetic complications: no   No notable events documented.   Last Vitals:  Vitals:   02/04/22 1217 02/04/22 1227  BP: 116/69 121/69  Pulse: 91 79  Resp: 15 18  Temp: (!) 35.8 C   SpO2: 98% 99%    Last Pain:  Vitals:   02/04/22 1227  TempSrc:   PainSc: 0-No pain                 Darrin Nipper

## 2022-02-05 ENCOUNTER — Encounter: Payer: Self-pay | Admitting: Gastroenterology

## 2022-04-04 ENCOUNTER — Other Ambulatory Visit: Payer: Self-pay | Admitting: Gastroenterology

## 2022-04-07 ENCOUNTER — Encounter: Payer: Self-pay | Admitting: Gastroenterology

## 2022-04-07 ENCOUNTER — Ambulatory Visit: Payer: BC Managed Care – PPO | Admitting: Gastroenterology

## 2022-04-16 ENCOUNTER — Other Ambulatory Visit: Payer: Self-pay

## 2022-04-16 DIAGNOSIS — E538 Deficiency of other specified B group vitamins: Secondary | ICD-10-CM

## 2022-04-16 DIAGNOSIS — D508 Other iron deficiency anemias: Secondary | ICD-10-CM

## 2022-04-17 ENCOUNTER — Inpatient Hospital Stay: Payer: BC Managed Care – PPO | Attending: Internal Medicine

## 2022-04-18 ENCOUNTER — Encounter: Payer: Self-pay | Admitting: Internal Medicine

## 2022-04-24 MED FILL — Iron Sucrose Inj 20 MG/ML (Fe Equiv): INTRAVENOUS | Qty: 10 | Status: AC

## 2022-04-25 ENCOUNTER — Inpatient Hospital Stay: Payer: Self-pay

## 2022-04-25 ENCOUNTER — Inpatient Hospital Stay: Payer: Self-pay | Attending: Internal Medicine | Admitting: Internal Medicine

## 2022-04-25 NOTE — Progress Notes (Deleted)
[facility] NOTE  Patient Care Team: Cletis Athens, MD as PCP - General (Internal Medicine) Cammie Sickle, MD as Consulting Physician (Oncology)  CHIEF COMPLAINTS/PURPOSE OF CONSULTATION: ANEMIA   HEMATOLOGY HISTORY  # [facility] 2023- Hb-8.3; MCV-platelets- WBC; Iron sat- ferritin;  EGD/colonoscopy-[poor prep] x2 [3 years ago; [facility]]   # GASTRIC BY [facility] [dec 2001; revision in 2017]; hernia repair-   #Severe vitamin a deficiency/D EK    Latest Reference Range & Units Most Recent 07/15/21 12:15  Iron 28 - 170 ug/dL 31 07/15/21 12:15 31  UIBC ug/dL 392 07/15/21 12:15 392  TIBC 250 - 450 ug/dL 423 07/15/21 12:15 423  Saturation Ratios 10.4 - 31.8 % 7 (L) 07/15/21 12:15 7 (L)  Vitamin D, 25-Hydroxy 30 - 100 ng/mL 9.05 (L) 07/15/21 12:15 9.05 (L)  Vitamin A (Retinoic Acid) 20.1 - 62.0 ug/dL <2.5 (L) 07/15/21 12:15 <2.5 (L)  Vitamin E (Alpha Tocopherol) 7.0 - 25.1 mg/L 4.6 (L) 07/15/21 12:15 4.6 (L)  Vitamin E(Gamma Tocopherol) 0.5 - 5.5 mg/L 0.2 (L) (C) 07/15/21 12:15 0.2 (L) (C)  WBC 4.0 - 10.5 K/uL 5.1 07/15/21 12:15 5.1  RBC 3.87 - 5.11 MIL/uL 3.49 (L) 07/15/21 12:15 3.49 (L)  Hemoglobin 12.0 - 15.0 g/dL 8.4 (L) 07/15/21 12:15 8.4 (L)  HCT 36.0 - 46.0 % 28.8 (L) 07/15/21 12:15 28.8 (L)  MCV 80.0 - 100.0 fL 82.5 07/15/21 12:15 82.5  MCH 26.0 - 34.0 pg 24.1 (L) 07/15/21 12:15 24.1 (L)  MCHC 30.0 - 36.0 g/dL 29.2 (L) 07/15/21 12:15 29.2 (L)  RDW 11.5 - 15.5 % 18.7 (H) 07/15/21 12:15 18.7 (H)  Platelets 150 - 400 K/uL 164 07/15/21 12:15 164  (L): Data is abnormally low (H): Data is abnormally high (C): Corrected  HISTORY OF PRESENTING ILLNESS: Alone.  Ambulating independently.  Tamara Kelly 57 y.o.  female pleasant patient history of gastric bypass/malabsorption iron deficiency; multiple vitamin deficiencies is here for follow-up.  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  Patient status post IV iron infusion.  Improvement of energy levels.   Review of Systems  Constitutional:  Positive for malaise/fatigue. Negative for chills, diaphoresis, fever and weight loss.  HENT:  Negative for nosebleeds and sore throat.   Eyes:  Negative for double vision.  Respiratory:  Positive for shortness of breath. Negative for cough, hemoptysis, sputum production and wheezing.   Cardiovascular:  Negative for chest pain, palpitations, orthopnea and leg swelling.  Gastrointestinal:  Negative for abdominal pain, blood in stool, constipation, diarrhea, heartburn, melena, nausea and vomiting.  Genitourinary:  Negative for dysuria, frequency and urgency.  Musculoskeletal:  Negative for back pain and joint pain.  Skin: Negative.  Negative for itching and rash.  Neurological:  Positive for tingling and weakness. Negative for dizziness, focal weakness and headaches.  Endo/Heme/Allergies:  Does not bruise/bleed easily.  Psychiatric/Behavioral:  Negative for depression. The patient is not nervous/anxious and does not have insomnia.      MEDICAL HISTORY:  Past Medical History:  Diagnosis Date  . Anemia   . COVID-19 08/2020  . GERD (gastroesophageal reflux disease)   . Sleep apnea   . Ventricular premature depolarization   . Vitamin D deficiency     SURGICAL HISTORY: Past Surgical History:  Procedure Laterality Date  . COLONOSCOPY N/A 02/04/2022   Procedure: COLONOSCOPY;  Surgeon: Lin Landsman, MD;  Location: [facility] ENDOSCOPY;  Service: Gastroenterology;  Laterality: N/A;  . COLONOSCOPY WITH PROPOFOL  N/A 02/03/2022   Procedure: COLONOSCOPY WITH PROPOFOL;  Surgeon: Lin Landsman, MD;  Location: [facility] ENDOSCOPY;  Service: Gastroenterology;  Laterality: N/A;  . GASTRIC BYPASS    . HERNIA REPAIR      SOCIAL HISTORY: Social History   Socioeconomic History  . Marital status: Single    Spouse name: Not on file  . Number of children: Not on file  . Years  of education: Not on file  . Highest education level: Not on file  Occupational History  . Not on file  Tobacco Use  . Smoking status: Never  . Smokeless tobacco: Never  Vaping Use  . Vaping Use: Never used  Substance and Sexual Activity  . Alcohol use: Not Currently    Comment: occassional  . Drug use: Never  . Sexual activity: Not Currently  Other Topics Concern  . 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 Lives with fiance;and his son. Remote hx of smoking.    Social Determinants of Health   Financial Resource Strain: Not on file  Food Insecurity: Not on file  Transportation Needs: Not on file  Physical Activity: Not on file  Stress: Not on file  Social Connections: Not on file  Intimate Partner Violence: Not on file    FAMILY HISTORY: Family History  Problem Relation Age of Onset  . Breast cancer Mother 40       breast cancer twice  . Hypertension Father   . Thyroid disease Father     ALLERGIES:  has No Known Allergies.  MEDICATIONS:  Current Outpatient Medications  Medication Sig Dispense Refill  . acetaminophen (TYLENOL) 325 MG tablet Take by mouth.    . Beta Carotene (VITAMIN A) 25000 UNIT capsule Take 25,000 Units by mouth daily. PT STATES 4X A DAY.    . cholecalciferol (VITAMIN D3) 25 MCG (1000 UNIT) tablet Take 1,000 Units by mouth daily.    . copper tablet Take 2 mg by mouth daily.    . Ferrous Gluconate-C-Folic Acid (IRON-C PO) Take by mouth. Chewable 45mg  iron + Vit C    . GAVILYTE-G 236 g solution     . Multiple Vitamin (MULTI-VITAMIN) tablet Take by mouth. gummy    . Vitamin D, Ergocalciferol, (DRISDOL) 1.25 MG (50000 UNIT) CAPS capsule TAKE 1 CAPSULE BY MOUTH EVERY 7 DAYS 12 capsule 0  . vitamin E 1000 UNIT capsule Take 1,000 Units by mouth daily.    . vitamin k 100 MCG tablet Take 100 mcg by mouth daily. K1 120 mcg    . zinc sulfate 220 (50 Zn) MG capsule Take 220 mg by mouth  daily.     No current facility-administered medications for this visit.     Marland Kitchen  PHYSICAL EXAMINATION:   There were no vitals filed for this visit.   There were no vitals filed for this visit.    Physical Exam Vitals and nursing note reviewed.  HENT:     Head: Normocephalic and atraumatic.     Mouth/Throat:     Pharynx: Oropharynx is clear.  Eyes:     Extraocular Movements: Extraocular movements intact.     Pupils: Pupils are equal, round, and reactive to light.  Cardiovascular:     Rate and Rhythm: Normal rate and regular rhythm.  Pulmonary:     Comments: Decreased breath sounds bilaterally.  Abdominal:     Palpations: Abdomen is soft.  Musculoskeletal:  General: Normal range of motion.     Cervical back: Normal range of motion.  Skin:    General: Skin is warm.  Neurological:     General: No focal deficit present.     Mental Status: She is alert and oriented to person, place, and time.  Psychiatric:        Behavior: Behavior normal.        Judgment: Judgment normal.     LABORATORY DATA:  I have reviewed the data as listed Lab Results  Component Value Date   WBC 4.5 01/16/2022   HGB 11.9 (L) 01/16/2022   HCT 36.6 01/16/2022   MCV 93.4 01/16/2022   PLT 151 01/16/2022   Recent Labs    07/15/21 1215 09/02/21 1454 01/02/22 1622  NA 139 134* 141  K 3.8 3.9 4.1  CL 106 103 107  CO2 25 26 26   GLUCOSE 101* 96 96  BUN 13 19 15   CREATININE 0.88 0.77 0.79  CALCIUM 8.7* 8.6* 9.4  GFRNONAA >60 >60 >60  PROT 7.0 7.4 7.7  ALBUMIN 4.0 4.2 4.5  AST 16 16 20   ALT 16 17 24   ALKPHOS 62 58 60  BILITOT 1.2 1.0 1.1      No results found.  ASSESSMENT & PLAN:   No problem-specific Assessment & Plan notes found for this encounter.    All questions were answered. The patient knows to call the clinic with any problems, questions or concerns.    , MD 04/25/2022 2:02 PM

## 2022-04-25 NOTE — Assessment & Plan Note (Deleted)
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  March 2023- hemoglobin 8.3; iron studies consistent with iron deficiency; currently IV iron infusions.continue barimelt.   #Recommend proceeding with iron infusion today.  Patient is status she will need ongoing maintenance IV iron.  #Malabsorption/celiac deficiency of vitamin D;E;A;-defer to PCP ophthalmology for further supplementation.  # LOW B12- MAY 2023-775; recommend continue PO B12.   # DISPOSITION: # venofer  # venofer weekly x2- # follow up in 17months; MD- labs- cbc; possible venofer- Dr.B

## 2022-07-02 ENCOUNTER — Other Ambulatory Visit: Payer: Self-pay | Admitting: Gastroenterology

## 2023-05-17 IMAGING — CR DG CHEST 2V
2 series · 3 of 3 positions shown · non-contrast
Comparison: None.

CLINICAL DATA: Cough, shortness of breath, chills.  Congestion.

EXAM:
CHEST - 2 VIEW

[Series 1: chest pa · 0.14mm/px · 2 of 2 slices shown]
[im 1/2]
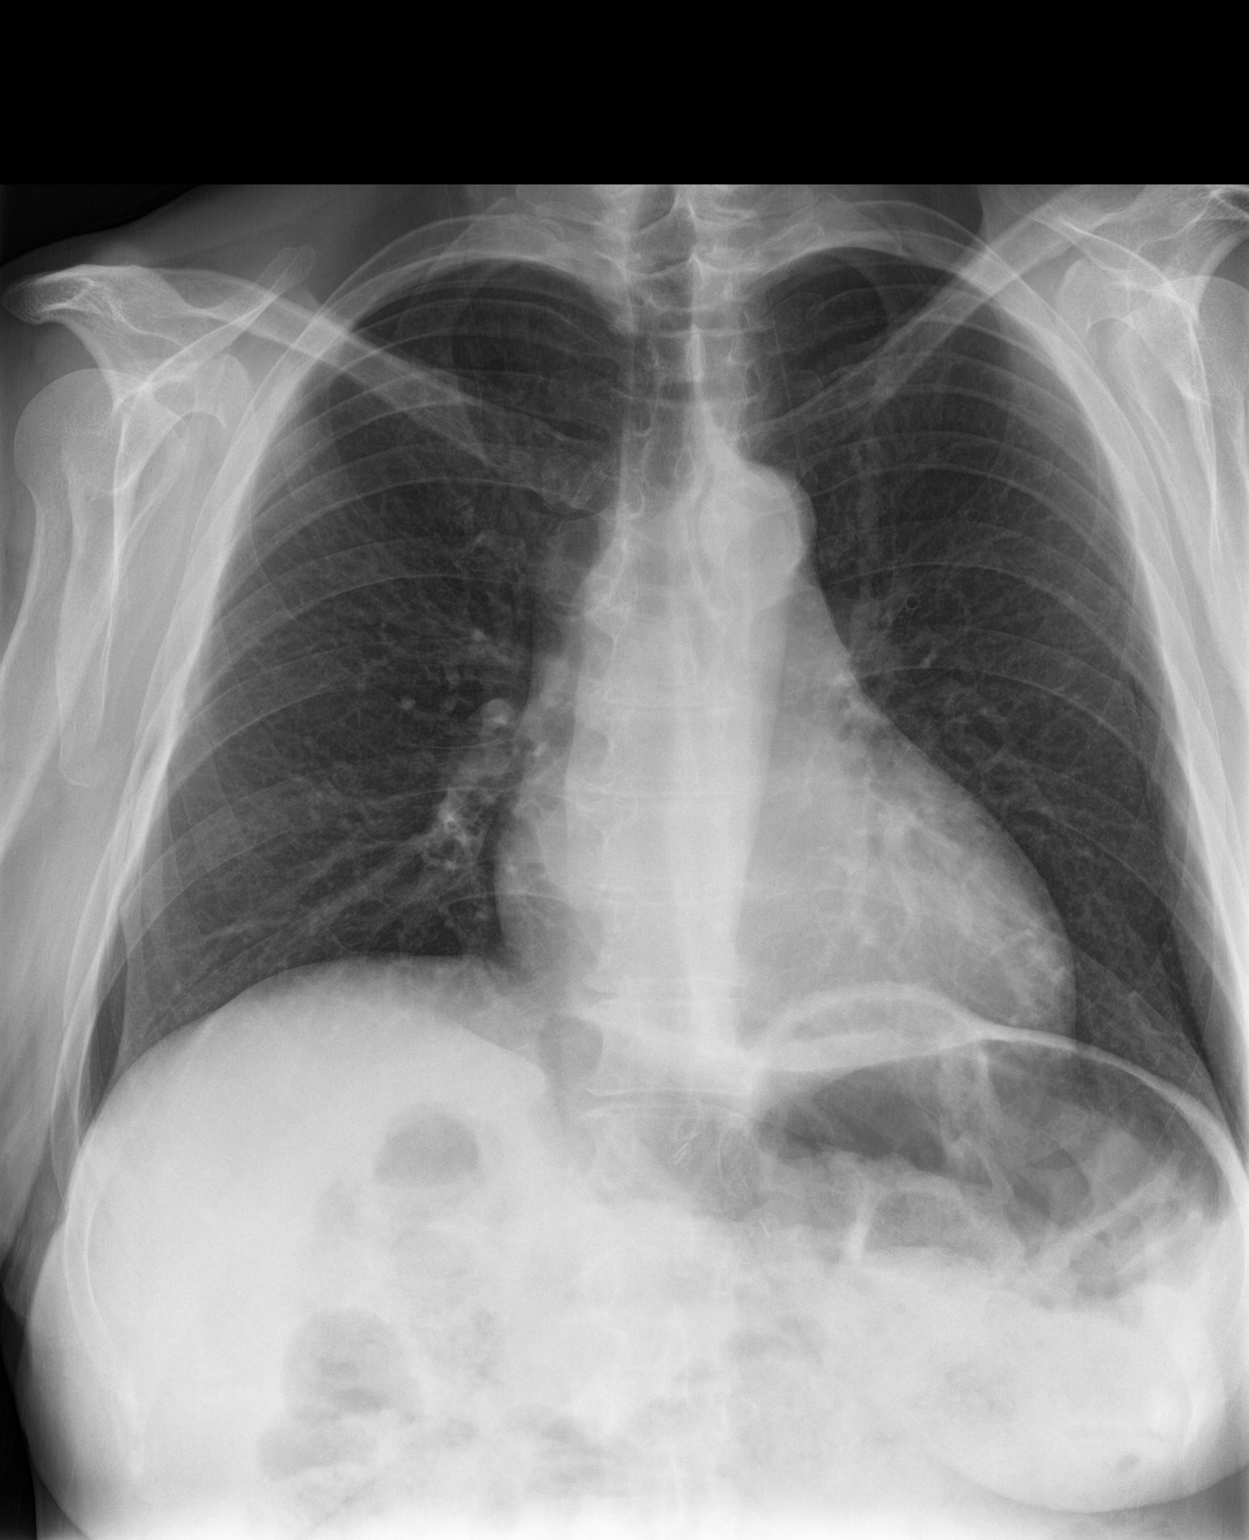
[im 2/2]
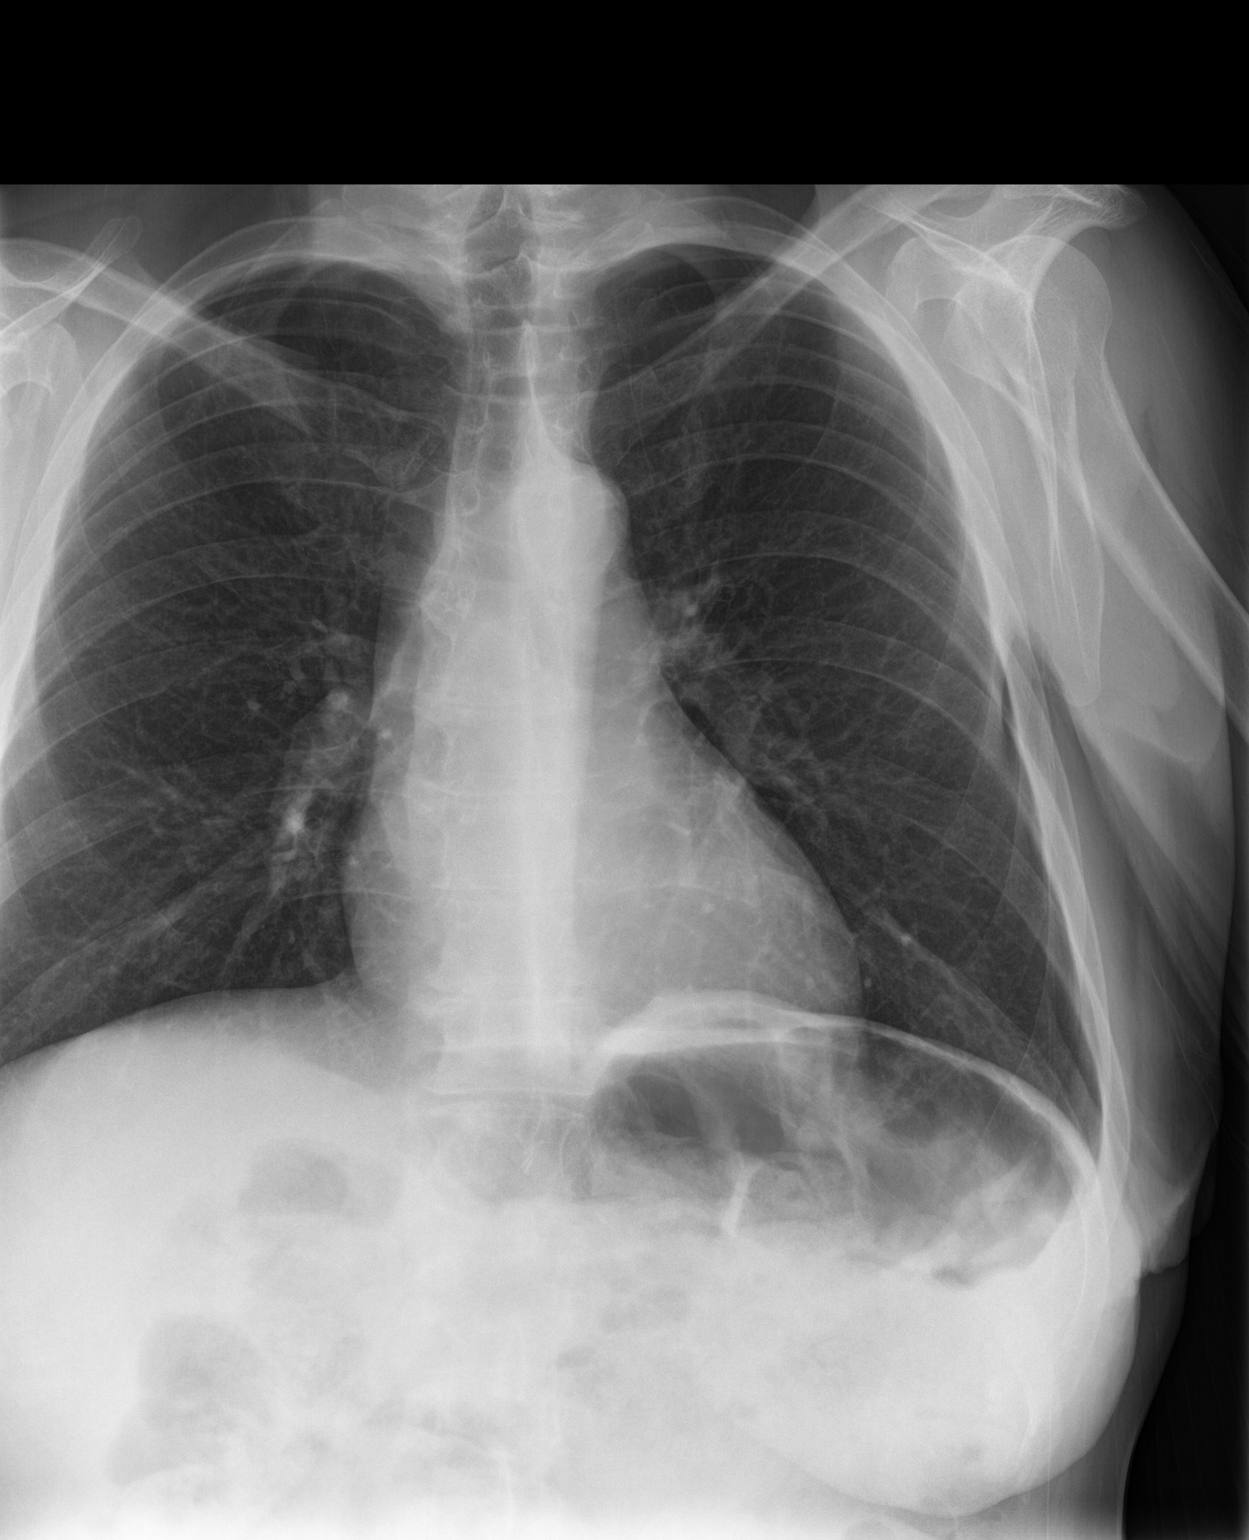

[chest lat]
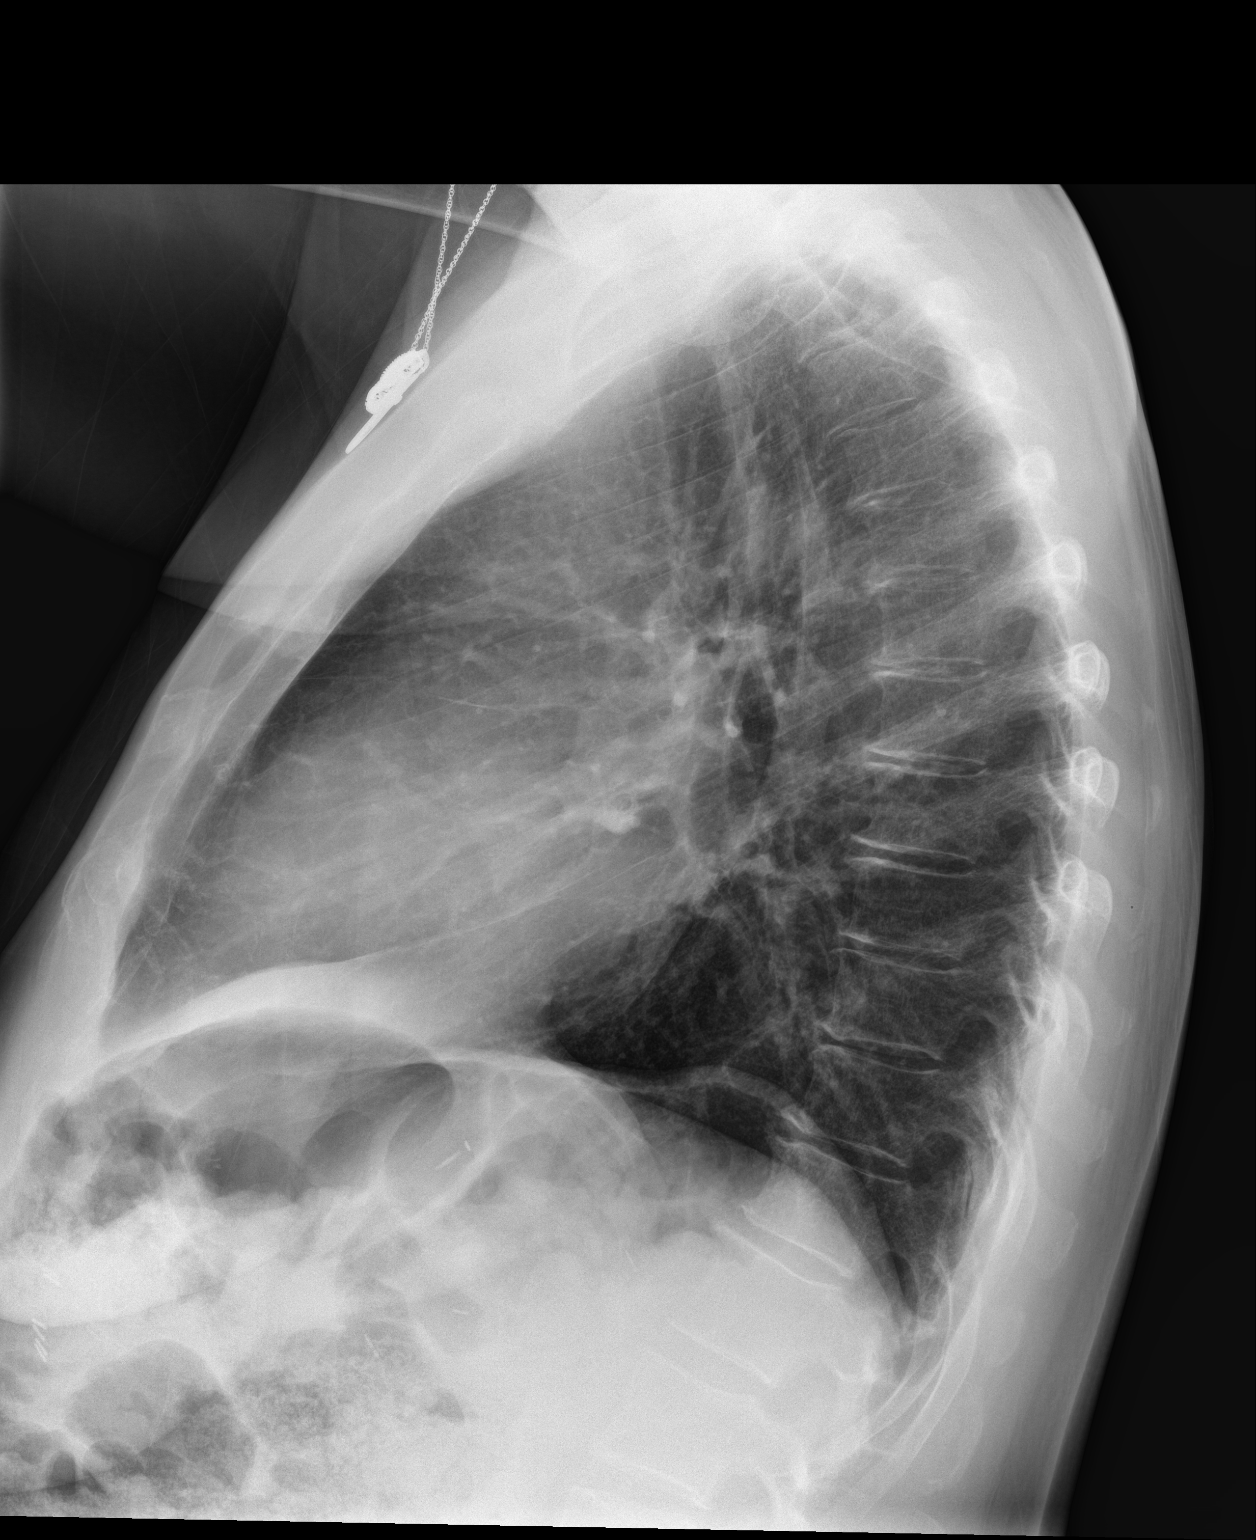

[3 of 3 positions shown; findings below may reference images not displayed]

FINDINGS: Heart and mediastinal shadows are normal. The lungs are clear. No
infiltrate, collapse or effusion. No significant bone finding.
IMPRESSION: No active cardiopulmonary disease.

## 2023-07-07 ENCOUNTER — Encounter: Payer: Self-pay | Admitting: Gastroenterology

## 2023-07-08 ENCOUNTER — Ambulatory Visit: Payer: Self-pay | Admitting: Gastroenterology

## 2023-08-19 NOTE — Progress Notes (Signed)
 PM&R  [facility]  Name: Tamara Kelly  MRN: [id number] DOB: [birth year]    Consulting Physician: Seeing at the request of Florie Elsie Ruth, MD    Chief Complaint: lumbar radiculopathy    History of Present Illness: Tamara Kelly is a 58 y.o. year old  has no past medical history on file. who presents with radiculopathy.   Functional Goals of Treatment: decrease pain and increase function   Onset/Context of pain:   Patient was lifting chair on Monday. Felt acute onset of severe low back and left leg pain. It was so severe, 10/10, led to ED visit. Only slightly improved since, 8/10, now. It is described as tingling. Aggravated by sitting and bending forward. Localized to posterior thigh and calf.   Red flag symptoms: Bowel and bladder: Denies  Saddle anesthesia: Denies Weakness: Denies Fever/Infection: Denies Unintentional weight loss: Denies   Pertinent History:  h/o Thrombocytopenia/bleeding tendency/platelet dysfunction: No  01/16/22 Hgb 11.9 Platelets 151   h/o Liver disease/abnormal liver function:  No  01/02/22 AST/ALT 20/24   h/o Chronic kidney disease (CKD)/abnormal kidney function:  No   Bun/ Cr 15/0/79 01/02/22   Patient on dialysis: No   h/o Diabetes: No   Anticoagulation/Aspirin: No                 Current pain treatments include:   Methocarbamol - some relief Acetaminophen - some relief Ibuprofen - some relief     Conservative Treatment:   Methocarbamol - some relief Acetaminophen - some relief Ibuprofen - some relief   The patient has not been actively engaged in physical therapy within the last 12 months.       History of Interventional Procedures/Surgery:   Prior Surgery:  NA   Injection History (date, procedure, %improvement): NA   Chart review: Today I have reviewed available medical information in the patient's medical record at [facility]), including relevant provider notes, laboratory work, and imaging.     Historical Review: I have reviewed the patient's past medical, surgical, social, and family history available in the EMR at this time along with supplemented information provided by the patient during the interview process today.   Past Medical History: No past medical history on file.  Past Surgical History: No past surgical history on file.  Family History: No family history on file.  Social History: Social History   Tobacco Use  . Smoking status: Not on file  . Smokeless tobacco: Not on file  Substance Use Topics  . Alcohol use: Not on file    Allergies: Patient has no known allergies.  Current Medications: Current Outpatient Medications  Medication Sig Dispense Refill  . beta carotene-7,500 mcg of RAE, 25,000 unit, 7,500 mcg of RAE (25,000 unit) capsule Take 1 capsule (7,500 mcg of RAE total) by mouth.    . cholecalciferol, vitamin D3 25 mcg, 1,000 units,, 1,000 unit (25 mcg) tablet Take 1 tablet (25 mcg total) by mouth.    . ergocalciferol -1,250 mcg, 50,000 unit, (DRISDOL ) 1,250 mcg (50,000 unit) capsule Take 1 capsule (1,250 mcg total) by mouth.    . famotidine (PEPCID) 20 MG tablet Take 1 tablet (20 mg total) by mouth.    . ferrous sulfate 325 (65 FE) MG tablet TAKE 1 T PO BID WITH MEALS    . iron ,carbonyl-vitamin C (VITRON-C) 65 mg iron - 125 mg TbEC Take 1 tablet by mouth.    . methocarbamol (ROBAXIN) 500 MG tablet Take 1.5 tablets (750 mg total) by mouth two (  2) times a day. 90 tablet 0  . multivitamin-minerals-lutein (MULTIVITAMIN 50 PLUS) Tab      No current facility-administered medications for this visit.     Work: Armed forces operational officer Housing: alone, from [place]    Medications and list of allergies: Medications and allergies were reviewed, reconciled and updated in the electronic medical record.    ROS: Positive for above mentioned musculoskeletal and neurological findings on 14 point system review. Areas of disrupted skin integrity/wounds/lesions:  No.   Physical Exam: Vitals:   08/19/23 1345  Temp: 36.6 C (97.8 F)  TempSrc: Temporal  Weight: 94.6 kg (208 lb 9.6 oz)  Height: 167.6 cm (5' 6)     General: Well-nourished, well-developed, in no  distress Respiratory: Non-labored breathing pattern on RA. No respiratory distress Skin: No appreciable rashes or skin breakdown Psych: Appropriate affect, answers questions appropriately   Musculoskeletal: Inspection -  no presence of spinal deformity, atrophy, rashes or lesions Palpation -  tender with palpation of the lumbar paraspinals bilaterally ROM - is full at the lumbar in all planes of motion    Special tests: Lumbar facet loading - and Slump +l   Neurologic:   Motor:  Muscle  Right Left  EHL (L5) 5/5 5/5  TA (L4) 5/5 5/5  GS (S1) 5/5 5/5  Quad (L3) 5/5 5/5    Reflexes:   Segment Reflex Right Left  L3-4 Patella 2+  2+  S1-2 Achilles 2+ 2+    Upper Motor Neuron Signs:  Clonus, None   (0=absent, 1=slight response, 2=brisk/normal, 3=very brisk, 4=clonus)   Sensory -  Intact to light touch and pinprick at bilateral lower extremities. Allodynia is not present. Hyperalgesia is not present.   Gait/Station - non-antalgic, able to heel and toe walk, no assistive device   Diagnostic Tests:  NA   Assessment: 58 y.o. year with a PMH including Anemia, GERD, Vitamin D  Deficiecny who presents for consultation regarding acute low back and left leg pain.  1. Lumbar radiculopathy      Acute low back and left leg pain consistent with L5/S1 radiculopathy ~ < 1 week. Will try medrol dose pack, gabapentin, acetaminophen, and PT. Follow up in 2 months to consider MRI Lumbar Spine.   Plan/Recommendations: We reviewed etiology, predisposing factor(s), natural course, imaging results as well as treatment options including medications, physical therapy/exercise, therapeutic injections, and surgery. The risks, consequences, alternatives, and benefits of various treatment  options were discussed with the patient in great detail. We have discussed and recommended the following:   - Medications:  - try topical menthol-salicylate or tiger balm or lidocaine  patch  - purchased over the counter - acetaminophen 1000 mg up to three times a day for 3 weeks, never with alcohol  - Trial of Gabapentin. If beneficial, you do not need to increase the amount. Otherwise, can increase as below:  7 days: 300 mg at night             7 days: 300 mg at morning and night             There after: 300 mg at morning, noon, and night - medrol dose pack    - Imaging:  - consider MRI lumbar   - Physical therapy/modalities/DME:  - PT [place]   - Interventional/Surgical procedures:  - NA   - Referrals: None indicated at this time.   - Activity: Continue activity as tolerated.   - Education: Myelopathy and Cauda equina and associated symptoms, including motor weakness, bowel/bladder  dysfunction, and perineal numbness were discussed. The patient was instructed to report to the Emergency department, if these symptoms occur.   - Follow-up:  2 month   Thank you for allowing us  the opportunity to participate in Rosaline DELENA Putt care.    Medical decision making for this patient was moderately complex given the patient's preexisting comorbidities, chronicity of the patient's current pathology, review of relevant records from other healthcare provides, independent interpretation of imaging and/or labs if appropriate, discussion regarding risks and benefits of proceeding forward with or holding off on scheduling a minor procedure if appropriate and designated above, the severity/progression of the pathology discussed, specific medication if reviewed, discussion regarding financial implications of treatment, and/or the risk of complication and/or morbidity that may exist with or without treatment.  Carliss Blower, DO  Interventional Pain/Spine Medicine Clinical Assistant Professor   Physical Medicine & Rehabilitation  [place] [facility]  - [facility]

## 2023-08-27 ENCOUNTER — Other Ambulatory Visit: Payer: Self-pay

## 2023-09-02 ENCOUNTER — Ambulatory Visit: Payer: Self-pay | Admitting: Gastroenterology

## 2023-09-02 ENCOUNTER — Encounter: Payer: Self-pay | Admitting: Internal Medicine

## 2023-09-13 ENCOUNTER — Other Ambulatory Visit: Payer: Self-pay

## 2023-09-13 ENCOUNTER — Emergency Department

## 2023-09-13 ENCOUNTER — Emergency Department
Admission: EM | Admit: 2023-09-13 | Discharge: 2023-09-13 | Disposition: A | Discharge status: Home / Self Care | Attending: Emergency Medicine | Admitting: Emergency Medicine

## 2023-09-13 DIAGNOSIS — R0781 Pleurodynia: Secondary | ICD-10-CM | POA: Insufficient documentation

## 2023-09-13 NOTE — ED Provider Notes (Signed)
 [facility] Emergency Department Provider Note     Event Date/Time   First MD Initiated Contact with Patient 09/13/23 1816     (approximate)   History   Rib Injury   HPI  Tamara Kelly is a 58 y.o. female presents to the ED for evaluation of right lower rib pain.  Patient reports earlier today she was in her garden and was bending over trying to pull up weeds from the ground.  She reports she heard an audible pop to the bottom right rib cage.  Denies shortness of breath and chest pain.  No other complaint.     Physical Exam   Triage Vital Signs: ED Triage Vitals  Encounter Vitals Group     BP 09/13/23 1806 110/70     Systolic BP Percentile --      Diastolic BP Percentile --      Pulse Rate 09/13/23 1806 (!) 59     Resp 09/13/23 1806 17     Temp 09/13/23 1806 98.5 F (36.9 C)     Temp Source 09/13/23 1806 Oral     SpO2 09/13/23 1806 100 %     Weight 09/13/23 1807 201 lb (91.2 kg)     Height 09/13/23 1807 5\' 5"  (1.651 m)     Head Circumference --      Peak Flow --      Pain Score 09/13/23 1806 5     Pain Loc --      Pain Education --      Exclude from Growth Chart --     Most recent vital signs: Vitals:   09/13/23 1806  BP: 110/70  Pulse: (!) 59  Resp: 17  Temp: 98.5 F (36.9 C)  SpO2: 100%    General Awake, no distress.  HEENT NCAT. CV:  Good peripheral perfusion.  RRR RESP:  Normal effort.  LCTAB ABD:  No distention.  Soft, nontender Other:  Mild tenderness to palpation over anterior-lateral aspect of right rib # 7 & 8. No bruising or crepitus noted.    ED Results / Procedures / Treatments   Labs (all labs ordered are listed, but only abnormal results are displayed) Labs Reviewed - No data to display   RADIOLOGY  I personally viewed and evaluated these images as part of my medical decision making, as well as reviewing the written report by the radiologist.  ED Provider Interpretation: No bony abnormality  noted  DG Ribs Unilateral W/Chest Right Result Date: 09/13/2023 CLINICAL DATA:  pain EXAM: RIGHT RIBS AND CHEST - 3+ VIEW COMPARISON:  Chest x-ray 09/07/2020 FINDINGS: The heart and mediastinal contours are within normal limits. No focal consolidation. No pulmonary edema. No pleural effusion. No pneumothorax. No acute osseous abnormality. The marker overlies the right lower chest. No acute displaced fracture or other bone lesions are seen involving the right ribs. IMPRESSION: 1. No acute cardiopulmonary abnormality. 2. No acute displaced right rib fracture. Please note, nondisplaced rib fractures may be occult on radiograph. Electronically Signed   By: Morgane  Naveau M.D.   On: 09/13/2023 19:06    PROCEDURES:  Critical Care performed: No  Procedures   MEDICATIONS ORDERED IN ED: Medications - No data to display   IMPRESSION / MDM / ASSESSMENT AND PLAN / ED COURSE  I reviewed the triage vital signs and the nursing notes.  Clinical Course as of 09/13/23 1917  Sun Sep 13, 2023  1843 Declined pain medication [MH]    Clinical Course User Index [MH] Billye Buerger, PA-C    58 y.o. female presents to the emergency department for evaluation and treatment of acute right rib cage pain . See HPI for further details.   Differential diagnosis includes, but is not limited to rib fracture, flail chest, contusion, pneumothorax  Patient's presentation is most consistent with acute complicated illness / injury requiring diagnostic workup.  Patient is alert and oriented.  She is hemodynamically stable.  Physical exam findings are reassuring.  X-ray reveals no acute displaced fractures or pneumothorax.  Advised patient NSAID use for discomfort.  She is in stable condition for discharge home.  Encouraged to follow-up with her primary care provider.  ED precautions discussed.  FINAL CLINICAL IMPRESSION(S) / ED DIAGNOSES   Final diagnoses:  Rib pain on right side      Rx / DC Orders   ED Discharge Orders     None        Note:  This document was prepared using Dragon voice recognition software and may include unintentional dictation errors.    Phyllis Breeze, Kaly Mcquary A, PA-C 09/13/23 1918    Lubertha Rush, MD 09/14/23 [id number]

## 2023-09-13 NOTE — ED Triage Notes (Signed)
 Patient reports bending over in garden and feeling bottom right rib pop. Patient tender to palpation of rib/ no bruising noted. Denies SOB.

## 2023-09-13 NOTE — Discharge Instructions (Addendum)
 You were evaluated in the ED for right rib pain.  Your x-ray is reassuring.  There is no fracture or broken bone.  Get plenty of rest and avoid activities that aggravate the pain.  Apply ice or heat packs to the affected area this can help reduce pain and inflammation.  Alternating between Tylenol and ibuprofen can help manage pain if needed.  Follow-up with your primary care.

## 2024-02-02 ENCOUNTER — Encounter: Payer: Self-pay | Admitting: Internal Medicine

## 2024-02-09 ENCOUNTER — Ambulatory Visit (INDEPENDENT_AMBULATORY_CARE_PROVIDER_SITE_OTHER): Payer: PRIVATE HEALTH INSURANCE

## 2024-02-09 ENCOUNTER — Encounter: Payer: Self-pay | Admitting: Internal Medicine

## 2024-02-09 VITALS — BP 106/70 | HR 54 | Ht 66.0 in | Wt 203.6 lb

## 2024-02-09 DIAGNOSIS — I493 Ventricular premature depolarization: Secondary | ICD-10-CM | POA: Diagnosis not present

## 2024-02-09 DIAGNOSIS — E559 Vitamin D deficiency, unspecified: Secondary | ICD-10-CM | POA: Diagnosis not present

## 2024-02-09 DIAGNOSIS — Z9884 Bariatric surgery status: Secondary | ICD-10-CM | POA: Diagnosis not present

## 2024-02-09 DIAGNOSIS — Z1231 Encounter for screening mammogram for malignant neoplasm of breast: Secondary | ICD-10-CM

## 2024-02-09 DIAGNOSIS — D508 Other iron deficiency anemias: Secondary | ICD-10-CM

## 2024-02-09 NOTE — Progress Notes (Signed)
 New Patient Visit   Physician: Kaycee Mcgaugh A Lanisa Ishler, MD  Patient: Tamara Kelly   DOB: 30-Dec-1965   58 y.o. Female  MRN: [id number] Visit Date: 02/09/2024   Chief Complaint  Patient presents with   Establish Care   Subjective  Tamara Kelly is a 58 y.o. female who presents today as a new patient to establish care.   HPI  Discussed the use of AI scribe software for clinical note transcription with the patient, who gave verbal consent to proceed.  History of Present Illness   Tamara Kelly is a 58 year old female with a history of gastric bypass and malabsorption who presents for evaluation of nutritional deficiencies and overall health management.  Nutritional deficiencies and malabsorption - History of gastric bypass surgery in 2001 or 2002, with revision in 2018 - Malabsorption since revision, resulting in deficiencies of vitamins A, D, B12, and iron  - Difficulty maintaining adequate vitamin levels despite supplementation - Previously treated with liquid vitamin A  and IV iron  infusions - Last comprehensive laboratory evaluation approximately two years ago  Cardiovascular symptoms and arrhythmia - History of premature ventricular contractions (PVCs), previously monitored with Holter monitor - History of chest pain attributed to anxiety - Previously prescribed antihypertensive medication, which resulted in hospitalization due to naturally low blood pressure - No current chest pain, shortness of breath, or palpitations  Sleep disturbance and sleep apnea - History of sleep apnea associated with weight gain post-gastric bypass - Uncertain if sleep apnea persists after weight loss, but not reporting fatigue and feels symptoms have resolved.  No CPAP use  Musculoskeletal pain - Chronic back pain, improved with physical activity - Uses muscle relaxants as needed for symptom relief  - No formal exercise  Weight history and management - Maximum pre-bypass weight of 440  pounds - Weight loss following gastric bypass and revision - now at 201 lbs  Neurocognitive and emotional symptoms - Emotional eating, particularly following a recent breakup - Concerns regarding possible issues in focus and concentration - Attempts to manage cognitive function through lifestyle adjustments, including monitoring screen time  Gastrointestinal symptoms and screening - No current heartburn symptoms - Colonoscopy performed one year ago, with difficulty achieving adequate bowel preparation  Cancer screening and family history - Family history of breast cancer (mother) and skin cancer (father) - Last mammogram performed three years ago; currently due for repeat screening         ASSESSMENT & PLAN  Encounter Diagnoses  Name Primary?   Gastric bypass status for obesity Yes   Iron  deficiency anemia secondary to inadequate dietary iron  intake    Vitamin D  deficiency    PVC (premature ventricular contraction)     Orders Placed This Encounter  Procedures   CBC with Differential/Platelet   Comprehensive metabolic panel with GFR   Hemoglobin A1c   Lipid panel   Urinalysis, Routine w reflex microscopic   B12 and Folate Panel   Iron , TIBC and Ferritin Panel   VITAMIN D  25 Hydroxy (Vit-D Deficiency, Fractures)   TSH + free T4    Assessment and Plan    Malabsorption and nutritional deficiencies following bariatric surgery Chronic 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 Not on comprehensive vitamin regimen. - Order lab tests for vitamin D , iron , B12, folate, and other relevant levels. - Prescribe two adult multivitamins with minerals daily, including iron , folic acid , thiamine, and calcium. - Consider IV iron  if oral supplementation is insufficient.  Back pain Intermittent back pain worsened  by activity but improved with increased activity. - Provide physical therapy referral for back conditioning and strengthening  exercises. - Recommend light weight training with 2-5 pound weights. - Advise against overuse of muscle relaxants and gabapentin.  Premature ventricular contractions (PVCs) PVCs with previous inappropriate treatment with blood pressure medication. Metoprolol not tolerated due to hypotension. - Monitor for increase in frequency or symptoms of PVCs. - Consider repeat Holter monitoring if symptoms worsen.  Low blood pressure Chronic low blood pressure, currently 106/70 mmHg. Previous inappropriate treatment for PVCs. - Monitor blood pressure and symptoms. - Avoid medications that may lower blood pressure further.             Objective  BP 106/70   Pulse (!) 54   Ht 5' 6 (1.676 m)   Wt 203 lb 9.6 oz (92.4 kg)   SpO2 97%   BMI 32.86 kg/m      Review of Systems  Constitutional:  Negative for chills, fever and weight loss.  Eyes:  Negative for blurred vision. h Respiratory:  Negative for cough and shortness of breath.   Cardiovascular:  Negative for chest pain and palpitations.  Skin:  Negative for rash.  Psychiatric/Behavioral:  Negative for depression. The patient is not nervous/anxious.      Physical Exam Physical Exam Vitals reviewed.  Constitutional:      Appearance: Normal appearance. Well-developed with normal weight.  HENT:     Head: Normocephalic and atraumatic.  Normal mucous membranes, no oral lesions Eyes:     Pupils: Pupils are equal, round, and reactive to light.  Neck:     Thyroid: No thyroid mass or thyromegaly.  Cardiovascular:     Rate and Rhythm: Normal rate and regular rhythm. Normal heart sounds. Normal peripheral pulses Pulmonary:     Normal breath sounds with normal effort Abdominal:   Abdomen is soft, without tenderness or noted hepatosplenomegaly Musculoskeletal:        General: No swelling or edema  Lymphadenopathy:     Cervical: No cervical adenopathy.  Skin:    General: Skin is warm and dry without noticeable rash. Neurological:      General: No focal deficit present.  Psychiatric:        Mood and Affect: Mood, behavior and cognition normal   Past Medical History:  Diagnosis Date   Anemia    COVID-19 08/2020   GERD (gastroesophageal reflux disease)    Sleep apnea    Ventricular premature depolarization    Vitamin D  deficiency    Past Surgical History:  Procedure Laterality Date   COLONOSCOPY N/A 02/04/2022   Procedure: COLONOSCOPY;  Surgeon: Unk Corinn Skiff, MD;  Location: [facility] ENDOSCOPY;  Service: Gastroenterology;  Laterality: N/A;   COLONOSCOPY WITH PROPOFOL  N/A 02/03/2022   Procedure: COLONOSCOPY WITH PROPOFOL ;  Surgeon: Unk Corinn Skiff, MD;  Location: [facility] ENDOSCOPY;  Service: Gastroenterology;  Laterality: N/A;   GASTRIC BYPASS     HERNIA REPAIR     Family Status  Relation Name Status   Mother  Alive   Father  Alive  No partnership data on file   Family History  Problem Relation Age of Onset   Breast cancer Mother 25       breast cancer twice   Hypertension Father    Thyroid disease Father    Social History   Socioeconomic History   Marital status: Single    Spouse name: Not on file   Number of children: Not on file   Years of education: Not on  file   Highest education level: Not on file  Occupational History   Not on file  Tobacco Use   Smoking status: Never   Smokeless tobacco: Never  Vaping Use   Vaping status: Never Used  Substance and Sexual Activity   Alcohol use: Not Currently    Comment: occassional   Drug use: Never   Sexual activity: Not Currently  Other Topics Concern   Not on file  Social History Narrative   Does medical billing; 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 Lives with fiance;and his son. Remote hx of smoking.    Social Drivers of Corporate investment banker Strain: Not on file  Food Insecurity: Not on file  Transportation Needs: Not on file  Physical Activity: Not on file  Stress: Not on file  Social Connections: Not on file    Outpatient Medications Prior to Visit  Medication Sig   Beta Carotene (VITAMIN A ) 25000 UNIT capsule Take 25,000 Units by mouth daily. PT STATES 4X A DAY.   ibuprofen (ADVIL) 800 MG tablet Take 800 mg by mouth every 6 (six) hours as needed.   LUTEIN PO    Multiple Vitamin (MULTI-VITAMIN) tablet Take by mouth. gummy   [DISCONTINUED] acetaminophen (TYLENOL) 325 MG tablet Take by mouth.   [DISCONTINUED] cholecalciferol (VITAMIN D3) 25 MCG (1000 UNIT) tablet Take 1,000 Units by mouth daily.   [DISCONTINUED] copper  tablet Take 2 mg by mouth daily.   [DISCONTINUED] famotidine (PEPCID) 20 MG tablet Take 20 mg by mouth daily.   [DISCONTINUED] Ferrous Gluconate-C-Folic Acid  (IRON -C PO) Take by mouth. Chewable 45mg  iron  + Vit C   [DISCONTINUED] ferrous sulfate 325 (65 FE) MG tablet Take 325 mg by mouth 2 (two) times daily with a meal.   [DISCONTINUED] gabapentin (NEURONTIN) 300 MG capsule Take 300 mg by mouth. Trial of Gabapentin. If beneficial, you do not need to increase the amount. Otherwise, can increase as below: 7 days: 300 mg at night 7 days: 300 mg at morning and night There after: 300 mg at morning, noon, and nigh   [DISCONTINUED] GAVILYTE-G 236 g solution    [DISCONTINUED] traMADol (ULTRAM) 50 MG tablet Take 50 mg by mouth every 6 (six) hours as needed.   [DISCONTINUED] Vitamin D , Ergocalciferol , (DRISDOL ) 1.25 MG (50000 UNIT) CAPS capsule TAKE 1 CAPSULE BY MOUTH EVERY 7 DAYS   [DISCONTINUED] vitamin E 1000 UNIT capsule Take 1,000 Units by mouth daily.   [DISCONTINUED] vitamin k 100 MCG tablet Take 100 mcg by mouth daily. K1 120 mcg   [DISCONTINUED] zinc  sulfate 220 (50 Zn) MG capsule Take 220 mg by mouth daily.   No facility-administered medications prior to visit.   No Known Allergies  Immunization History  Administered Date(s) Administered   Influenza, Seasonal, Injecte, Preservative Fre 07/14/2013   Influenza,inj,Quad PF,6+ Mos 02/09/2019   Influenza,inj,quad, With Preservative  02/09/2018   Influenza-Unspecified 02/03/2015, 01/19/2017, 06/19/2018   Pneumococcal Polysaccharide-23 02/09/2018   Tdap 01/02/2012   Zoster Recombinant(Shingrix) 02/09/2018, 04/09/2018    Health Maintenance  Topic Date Due   HIV Screening  Never done   Hepatitis C Screening  Never done   Hepatitis B Vaccines 19-59 Average Risk (1 of 3 - 19+ 3-dose series) Never done   Mammogram  Never done   Pneumococcal Vaccine: 50+ Years (2 of 2 - PCV) 02/10/2019   Cervical Cancer Screening (HPV/Pap Cotest)  12/13/2019   DTaP/Tdap/Td (2 - Td or Tdap) 01/01/2022   Influenza Vaccine  11/20/2023   COVID-19  Vaccine (1 - 2025-26 season) Never done   Colonoscopy  02/05/2032   Zoster Vaccines- Shingrix  Completed   HPV VACCINES  Aged Out   Meningococcal B Vaccine  Aged Out    Patient Care Team: Britta King, MD as PCP - General (Internal Medicine) Rennie Cindy SAUNDERS, MD as Consulting Physician (Oncology)  Depression Screen    08/27/2021    3:03 PM  PHQ 2/9 Scores  PHQ - 2 Score 0     Parris DELENA Juneau, MD  [facility] [phone] (phone) [phone] (fax)  [facility]

## 2024-02-10 LAB — LIPID PANEL
Cholesterol: 131 mg/dL (ref <200)
HDL: 41 mg/dL — ABNORMAL LOW (ref >=50)
LDL Cholesterol (Calc): 75 mg/dL
Non-HDL Cholesterol (Calc): 90 mg/dL (ref <130)
Total CHOL/HDL Ratio: 3.2 (calc) (ref <5.0)
Triglycerides: 73 mg/dL (ref <150)

## 2024-02-10 LAB — CBC WITH DIFFERENTIAL/PLATELET
Absolute Lymphocytes: 1217 {cells}/uL (ref 850–3900)
Absolute Monocytes: 454 {cells}/uL (ref 200–950)
Basophils Absolute: 40 {cells}/uL (ref 0–200)
Basophils Relative: 1.1 %
Eosinophils Absolute: 58 {cells}/uL (ref 15–500)
Eosinophils Relative: 1.6 %
HCT: 35.3 % (ref 35.0–45.0)
Hemoglobin: 11.3 g/dL — ABNORMAL LOW (ref 11.7–15.5)
MCH: 30.2 pg (ref 27.0–33.0)
MCHC: 32 g/dL (ref 32.0–36.0)
MCV: 94.4 fL (ref 80.0–100.0)
MPV: 11 fL (ref 7.5–12.5)
Monocytes Relative: 12.6 %
Neutro Abs: 1832 {cells}/uL (ref 1500–7800)
Neutrophils Relative %: 50.9 %
Platelets: 157 Thousand/uL (ref 140–400)
RBC: 3.74 Million/uL — ABNORMAL LOW (ref 3.80–5.10)
RDW: 12 % (ref 11.0–15.0)
Total Lymphocyte: 33.8 %
WBC: 3.6 Thousand/uL — ABNORMAL LOW (ref 3.8–10.8)

## 2024-02-10 LAB — URINALYSIS, ROUTINE W REFLEX MICROSCOPIC
Bacteria, UA: NONE SEEN /HPF
Bilirubin Urine: NEGATIVE
Glucose, UA: NEGATIVE
Hgb urine dipstick: NEGATIVE
Hyaline Cast: NONE SEEN /LPF
Ketones, ur: NEGATIVE
Nitrite: NEGATIVE
Protein, ur: NEGATIVE
Specific Gravity, Urine: 1.015 (ref 1.001–1.035)
pH: 5.5 (ref 5.0–8.0)

## 2024-02-10 LAB — VITAMIN D 25 HYDROXY (VIT D DEFICIENCY, FRACTURES): Vit D, 25-Hydroxy: 17 ng/mL — ABNORMAL LOW (ref 30–100)

## 2024-02-10 LAB — COMPREHENSIVE METABOLIC PANEL WITH GFR
AG Ratio: 2.3 (calc) (ref 1.0–2.5)
ALT: 19 U/L (ref 6–29)
AST: 20 U/L (ref 10–35)
Albumin: 4.4 g/dL (ref 3.6–5.1)
Alkaline phosphatase (APISO): 59 U/L (ref 37–153)
BUN: 12 mg/dL (ref 7–25)
CO2: 26 mmol/L (ref 20–32)
Calcium: 9 mg/dL (ref 8.6–10.4)
Chloride: 108 mmol/L (ref 98–110)
Creat: 0.63 mg/dL (ref 0.50–1.03)
Globulin: 1.9 g/dL (ref 1.9–3.7)
Glucose, Bld: 78 mg/dL (ref 65–99)
Potassium: 4 mmol/L (ref 3.5–5.3)
Sodium: 139 mmol/L (ref 135–146)
Total Bilirubin: 0.6 mg/dL (ref 0.2–1.2)
Total Protein: 6.3 g/dL (ref 6.1–8.1)
eGFR: 103 mL/min/1.73m2 (ref >=60)

## 2024-02-10 LAB — IRON,TIBC AND FERRITIN PANEL
%SAT: 29 % (ref 16–45)
Ferritin: 21 ng/mL (ref 16–232)
Iron: 97 ug/dL (ref 45–232)
TIBC: 335 ug/dL (ref 250–450)

## 2024-02-10 LAB — HEMOGLOBIN A1C
Hgb A1c MFr Bld: 4.3 % (ref <5.7)
Mean Plasma Glucose: 77 mg/dL
eAG (mmol/L): 4.2 mmol/L

## 2024-02-10 LAB — B12 AND FOLATE PANEL
Folate: 23.5 ng/mL
Vitamin B-12: 738 pg/mL (ref 200–1100)

## 2024-02-10 LAB — TSH+FREE T4: TSH W/REFLEX TO FT4: 4.46 m[IU]/L (ref 0.40–4.50)

## 2024-02-15 ENCOUNTER — Encounter: Payer: Self-pay | Admitting: Internal Medicine

## 2024-03-01 ENCOUNTER — Ambulatory Visit: Payer: PRIVATE HEALTH INSURANCE

## 2024-03-01 VITALS — BP 109/77 | HR 69 | Ht 66.0 in | Wt 206.8 lb

## 2024-03-01 DIAGNOSIS — D649 Anemia, unspecified: Secondary | ICD-10-CM | POA: Insufficient documentation

## 2024-03-01 DIAGNOSIS — Z62819 Personal history of unspecified abuse in childhood: Secondary | ICD-10-CM | POA: Insufficient documentation

## 2024-03-01 DIAGNOSIS — Z9884 Bariatric surgery status: Secondary | ICD-10-CM

## 2024-03-01 DIAGNOSIS — E559 Vitamin D deficiency, unspecified: Secondary | ICD-10-CM | POA: Diagnosis not present

## 2024-03-01 DIAGNOSIS — E560 Deficiency of vitamin E: Secondary | ICD-10-CM | POA: Insufficient documentation

## 2024-03-01 DIAGNOSIS — E509 Vitamin A deficiency, unspecified: Secondary | ICD-10-CM | POA: Diagnosis not present

## 2024-03-01 MED ORDER — RAMELTEON 8 MG PO TABS: 8.0000 mg | ORAL_TABLET | Freq: Every day | ORAL | 0 refills | Status: DC

## 2024-03-01 NOTE — Progress Notes (Signed)
 Progress Note  Physician: Marck Mcclenny A Kenzli Barritt, MD   HPI: Tamara Kelly is a 58 y.o. female presenting on 03/01/2024 for Follow-up (Patient states she is  always feeling tired.) .  Discussed the use of AI scribe software for clinical note transcription with the patient, who gave verbal consent to proceed.  History of Present Illness   Tamara Kelly is a 58 year old female with a history of gastric bypass who presents for follow-up of her nutritional status and lab results.  Post-bariatric surgery nutritional status - History of gastric bypass surgery in 2001 with subsequent revision resulting in malabsorption issues - Takes a liquid vitamin formulation to aid absorption - Concern for vitamin A  deficiency due to previous severe depletion that nearly led to vision loss - Currently supplements with vitamin A  and E, taking at least 10,000 units of vitamin A  daily, - Vitamin D  level is slightly low at 17; taking 2,000 units of vitamin D  daily - B12 levels are within normal range - White blood cell count is 3.6  Iron  deficiency anemia - History of iron  deficiency anemia requiring prior iron  infusions - Current iron  levels are normal - Takes a multivitamin with folate   - Chronic low blood pressure with recent readings of 106/70 and 109/77 - No dizziness or lightheadedness  Sleep disturbance and fatigue - Significant sleep disturbances, often obtaining only 4-5 hours of sleep on bad nights - Frequent awakenings and difficulty falling back asleep - Previous trials of melatonin and Ambien without benefit - Attributes some fatigue to low vitamin D  levels         Medical history:  Relevant past medical, surgical, family and social history reviewed and updated as indicated. Interim medical history since our last visit reviewed.  Allergies and medications reviewed and updated.   ROS: Negative unless specifically indicated above in HPI.    Current Outpatient  Medications:    Beta Carotene (VITAMIN A ) 25000 UNIT capsule, Take 25,000 Units by mouth daily. PT STATES 4X A DAY., Disp: , Rfl:    Multiple Vitamin (MULTI-VITAMIN) tablet, Take by mouth. gummy, Disp: , Rfl:    ramelteon (ROZEREM) 8 MG tablet, Take 1 tablet (8 mg total) by mouth at bedtime., Disp: 10 tablet, Rfl: 0       Objective:     BP 109/77   Pulse 69   Ht 5' 6 (1.676 m)   Wt 206 lb 12.8 oz (93.8 kg)   SpO2 99%   BMI 33.38 kg/m   Wt Readings from Last 3 Encounters:  03/01/24 206 lb 12.8 oz (93.8 kg)  02/09/24 203 lb 9.6 oz (92.4 kg)  09/13/23 201 lb (91.2 kg)    Physical Exam  Physical Exam Vitals reviewed.  Constitutional:      Appearance: Normal appearance. Well-developed with normal weight.  Cardiovascular:     Rate and Rhythm: Normal rate and regular rhythm. Normal heart sounds. Normal peripheral pulses Pulmonary:     Normal breath sounds with normal effort Skin:    General: Skin is warm and dry without noticeable rash. Neurological:     General: No focal deficit present.  Psychiatric:        Mood and Affect: Mood, behavior and cognition normal      Assessment & Plan:   Encounter Diagnoses  Name Primary?   Symptomatic anemia Yes   Gastric bypass status for obesity    Vitamin D  deficiency    Vitamin A   deficiency    Vitamin E deficiency     Orders Placed This Encounter  Procedures   Vitamin A    Vitamin E   B12 and Folate Panel   VITAMIN D  25 Hydroxy (Vit-D Deficiency, Fractures)   CBC with Differential/Platelet   Comprehensive metabolic panel with GFR   Reticulocytes   Iron , TIBC and Ferritin Panel   Vitamin A      Assessment and Plan    Status post gastric bypass and revision with malabsorption   Gastric bypass in 2001 with revision has led to malabsorption. Iron  and B12 levels are normal, but past low vitamin A  and E levels have been linked to vision issues.  Vitamin A  and E levels were not drawn and need to be checked today.    -  Continue the current supplementation regimen and recheck labs in six months.   Anemia, unspecified   Mild anemia with decreased white blood cell count is present. Iron  and B12 levels are normal, suggesting other causes. Levels are stable, requiring no immediate intervention. Recheck blood counts in six months.  Vitamin D  deficiency   Vitamin D  level is slightly low at 17. The importance of vitamin D  for bone density and health was discussed. Continue vitamin D  supplementation at 2000 units daily and recheck levels in six months.Reticulocyte #  Suspected vitamin A  deficiency   Suspected due to past vision issues. Vitamin A  levels were checked today. Continue current vitamin A  supplementation and recheck levels in six months.  Sleep disturbance   She experiences difficulty sleeping, with past trials of melatonin and Ambien proving ineffective. Non-habit forming aids were discussed, and ramelteon was recommended. Take one hour before bedtime. Monitor response and adjust treatment as needed.   Note difficult childhood and traumatic relationships discussed in office  F/u in April with labs, repeat CBC.  Will review labs drawn today.

## 2024-03-04 LAB — VITAMIN E
Gamma-Tocopherol (Vit E): <1 mg/L (ref <4.4)
Vitamin E (Alpha Tocopherol): 4.7 mg/L — ABNORMAL LOW (ref 5.7–19.9)

## 2024-03-04 LAB — VITAMIN A: Vitamin A (Retinoic Acid): 34 ug/dL — ABNORMAL LOW (ref 38–98)

## 2024-03-07 ENCOUNTER — Ambulatory Visit: Payer: Self-pay

## 2024-03-09 ENCOUNTER — Telehealth (INDEPENDENT_AMBULATORY_CARE_PROVIDER_SITE_OTHER): Payer: PRIVATE HEALTH INSURANCE

## 2024-03-09 DIAGNOSIS — E559 Vitamin D deficiency, unspecified: Secondary | ICD-10-CM | POA: Diagnosis not present

## 2024-03-09 DIAGNOSIS — E560 Deficiency of vitamin E: Secondary | ICD-10-CM

## 2024-03-09 DIAGNOSIS — G4700 Insomnia, unspecified: Secondary | ICD-10-CM | POA: Insufficient documentation

## 2024-03-09 DIAGNOSIS — E509 Vitamin A deficiency, unspecified: Secondary | ICD-10-CM | POA: Diagnosis not present

## 2024-03-09 DIAGNOSIS — Z9884 Bariatric surgery status: Secondary | ICD-10-CM

## 2024-03-09 DIAGNOSIS — K909 Intestinal malabsorption, unspecified: Secondary | ICD-10-CM | POA: Insufficient documentation

## 2024-03-09 DIAGNOSIS — G4709 Other insomnia: Secondary | ICD-10-CM

## 2024-03-09 MED ORDER — VITAMIN A & D 10000-400 UNITS PO TABS: 1.0000 | ORAL_TABLET | Freq: Every day | ORAL | 1 refills | Status: AC

## 2024-03-09 MED ORDER — VITAMIN E 45 MG (100 UNIT) PO CAPS: 100.0000 [IU] | ORAL_CAPSULE | Freq: Every day | ORAL | 1 refills | Status: AC

## 2024-03-09 MED ORDER — VITAMIN E 45 MG (100 UNIT) PO CAPS: 100.0000 [IU] | ORAL_CAPSULE | Freq: Every day | ORAL | 1 refills | Status: DC

## 2024-03-09 NOTE — Progress Notes (Signed)
 Progress Note  Physician: Melva Faux A Atsushi Yom, MD   HPI: Tamara Kelly is a 58 y.o. female presenting on 03/09/2024 for No chief complaint on file. .  Discussed the use of AI scribe software for clinical note transcription with the patient, who gave verbal consent to proceed.  History of Present Illness   Tamara Kelly is a 58 year old female with a history of gastric bypass who presents for follow-up on her vitamin levels.  Micronutrient deficiencies following gastric bypass - History of gastric bypass surgery - Deficiencies in vitamins A, E, D, and B12 - Current laboratory findings: vitamin A  level 34, vitamin E level 4.7 (both low) - Vitamin A  deficiency managed with supplementation: 10,000 units every three days - No additional vitamin E supplementation outside of multivitamin - Vitamin A  and E deficiencies have affected vision  Sleep disturbance - Insomnia present - Previously trialed Rozerem for sleep, discontinued due to grogginess - Improvement in sleep quality after acquiring a new pillow      Medical history:  Relevant past medical, surgical, family and social history reviewed and updated as indicated. Interim medical history since our last visit reviewed.  Allergies and medications reviewed and updated.   ROS: Negative unless specifically indicated above in HPI.    Current Outpatient Medications:    Vitamins A & D (VITAMIN A  & D) 10000-400 units TABS, Take 1 tablet by mouth daily., Disp: 90 tablet, Rfl: 1   vitamin E 45 MG (100 UNITS) capsule, Take 1 capsule (100 Units total) by mouth daily., Disp: 90 capsule, Rfl: 1       Objective:     There were no vitals taken for this visit.  Wt Readings from Last 3 Encounters:  03/01/24 206 lb 12.8 oz (93.8 kg)  02/09/24 203 lb 9.6 oz (92.4 kg)  09/13/23 201 lb (91.2 kg)    Physical Exam  Physical Exam Vitals reviewed.  Constitutional:      Appearance: Normal appearance. Well-developed with  normal weight.  Cardiovascular:     Rate and Rhythm: Normal rate and regular rhythm. Normal heart sounds. Normal peripheral pulses Pulmonary:     Normal breath sounds with normal effort Skin:    General: Skin is warm and dry without noticeable rash. Neurological:     General: No focal deficit present.  Psychiatric:        Mood and Affect: Mood, behavior and cognition normal      Assessment & Plan:   Encounter Diagnoses  Name Primary?   Gastric bypass status for obesity Yes   Vitamin A  deficiency    Vitamin D  deficiency    Vitamin E deficiency    Intestinal malabsorption, unspecified type    Other insomnia     Orders Placed This Encounter  Procedures   Vitamin A    B12 and Folate Panel   VITAMIN D  25 Hydroxy (Vit-D Deficiency, Fractures)   Vitamin E   CBC with Differential/Platelet     Assessment and Plan    Gastric bypass status with intestinal malabsorption and vitamin A , E, D, and B12 deficiencies Gastric bypass has resulted in malabsorption and deficiencies in vitamins A, E, D, and B12. Low vitamin A  and E levels are impacting vision. - Increased vitamin A  to 10,000 units daily. - Prescribed vitamin E 45 mg or 100 units daily. - Recheck labs in February, including B12 and D levels.  Insomnia Stable currently without medication.  If she has a  change, follow up for further discussion.   F/u with labs end of Feb

## 2024-04-26 ENCOUNTER — Encounter: Payer: Self-pay | Admitting: Student

## 2024-04-26 ENCOUNTER — Ambulatory Visit
Admission: RE | Admit: 2024-04-26 | Discharge: 2024-04-26 | Disposition: A | Discharge status: Home / Self Care | Payer: PRIVATE HEALTH INSURANCE | Attending: Student | Admitting: Student

## 2024-04-26 ENCOUNTER — Ambulatory Visit: Payer: Self-pay | Admitting: *Deleted

## 2024-04-26 ENCOUNTER — Ambulatory Visit: Payer: Self-pay | Admitting: Student

## 2024-04-26 ENCOUNTER — Ambulatory Visit (INDEPENDENT_AMBULATORY_CARE_PROVIDER_SITE_OTHER): Payer: PRIVATE HEALTH INSURANCE | Admitting: Student

## 2024-04-26 ENCOUNTER — Encounter: Payer: Self-pay | Admitting: Internal Medicine

## 2024-04-26 ENCOUNTER — Ambulatory Visit
Admission: RE | Admit: 2024-04-26 | Discharge: 2024-04-26 | Disposition: A | Payer: PRIVATE HEALTH INSURANCE | Source: Ambulatory Visit | Attending: Student

## 2024-04-26 VITALS — BP 112/74 | HR 57 | Temp 97.9°F | Ht 66.0 in | Wt 201.0 lb

## 2024-04-26 DIAGNOSIS — R071 Chest pain on breathing: Secondary | ICD-10-CM | POA: Diagnosis not present

## 2024-04-26 DIAGNOSIS — M25512 Pain in left shoulder: Secondary | ICD-10-CM

## 2024-04-26 NOTE — Progress Notes (Signed)
 "  Established Patient Office Visit  Subjective   Patient ID: Tamara Kelly, female    DOB: 07/30/1965  Age: 59 y.o. MRN: [id number]  Chief Complaint  Patient presents with   Shoulder Pain    1 week of left shoulder pain, pain between shoulder blades started this morning, dull pain sensation     Tamara Kelly is a 59 y.o. person with medical hx listed below who presents today for left shoulder pain that has been getting worse of the past week. Reports a dull pressure like sensation between the shoulder blades when she woke up this morning. Pain radiates to the front of the chest with pressure like sensation. Feels she has a hard time taking a deep breath. Reports slight cough with sputum. No injuries or falls.  Denies fever, dizziness, dyspnea, LOC, HA, weakness, injury, or fall.   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   Vitamins A & D (VITAMIN A  & D) 10000-400 units TABS Take 1 tablet by mouth daily.   No facility-administered encounter medications on file as of 04/26/2024.    BP 112/74   Pulse (!) 57   Temp 97.9 F (36.6 C) (Oral)   Ht 5' 6 (1.676 m)   Wt 201 lb (91.2 kg)   SpO2 98%   BMI 32.44 kg/m  BP Readings from Last 3 Encounters:  04/26/24 112/74  03/01/24 109/77  02/09/24  106/70    Physical Exam Constitutional:      Appearance: Normal appearance.  HENT:     Mouth/Throat:     Mouth: Mucous membranes are moist.     Pharynx: Oropharynx is clear.  Cardiovascular:     Rate and Rhythm: Normal rate and regular rhythm.     Heart sounds: No murmur heard.    No friction rub. No gallop.  Pulmonary:     Effort: Pulmonary effort is normal.     Breath sounds: Normal breath sounds. No stridor. No rhonchi or rales.  Abdominal:     General: Abdomen is flat. Bowel sounds are normal. There is no distension.     Palpations: Abdomen is soft.     Tenderness: There is no abdominal tenderness.  Musculoskeletal:        General: Normal range of motion.     Right lower leg: No edema.     Left lower leg: No edema.     Comments: Pain with internal rotation of the left shoulder.  Skin:    General: Skin is warm and dry.     Capillary Refill: Capillary refill takes less than 2 seconds.  Neurological:     General: No focal deficit present.     Mental Status: She is alert and oriented to person, place, and time.  Psychiatric:  Mood and Affect: Mood normal.        Behavior: Behavior normal.        03/01/2024    9:35 AM 08/27/2021    3:03 PM  Depression screen PHQ 2/9  Decreased Interest 0 0  Down, Depressed, Hopeless 0 0  PHQ - 2 Score 0 0  Altered sleeping 1   Tired, decreased energy 2   Change in appetite 1   Feeling bad or failure about yourself  0   Trouble concentrating 1   Moving slowly or fidgety/restless 1   Suicidal thoughts 0   PHQ-9 Score 6        03/01/2024    9:36 AM  GAD 7 : Generalized Anxiety Score  Nervous, Anxious, on Edge 1  Control/stop worrying 1  Worry too much - different things 0  Trouble relaxing 1  Restless 1  Easily annoyed or irritable 1    No results found for any visits on 04/26/24.    The 10-year ASCVD risk score (Arnett DK, et al., 2019) is: 1.8%    Assessment & Plan:  Chest pain on breathing Acute pain of  left shoulder Patient presents for dull constant pressure-like left shoulder pain, and pain between the shoulder blades that radiates to the chest associated with mild dyspnea that started this morning.  Pain is nonexertional.  BP is stable, bradycardia which appears to be her baseline, and she is  well-appearing on exam. Possible atypical chest pain versus referred pain from chronic left shoulder.  EKG reveals bradycardia with rate of 57 which is at her baseline without acute ischemic changes, low voltages in anterior leads.  Will check chest chest x-ray given symptoms of mild dyspnea.  Recommended taking Tylenol and as needed NSAID for left shoulder pain.  Follow-up with PCP if pain continues and workup is negative.  ED precautions given for worsening chest pain, dyspnea, dizziness, exertional symptoms.      - EKG 12-Lead -     DG Chest 2 View; Future         Harlene Saddler, MD "

## 2024-04-26 NOTE — Telephone Encounter (Signed)
 FYI Only or Action Required?: FYI only for provider: appointment scheduled on 1/6.  Patient was last seen in primary care on 03/09/2024 by Zafirov, Clarissa A, MD.  Called Nurse Triage reporting Back Pain (Chest pain, trouble breathing).  Symptoms began today.  Interventions attempted: Nothing.  Symptoms are: unchanged.  Triage Disposition: See HCP Within 4 Hours (Or PCP Triage)  Patient/caregiver understands and will follow disposition?: Yes  Copied from CRM [id number]. Topic: Clinical - Red Word Triage >> Apr 26, 2024  8:07 AM Tamara Kelly wrote: Red Word that prompted transfer to Nurse Triage: Patient with pain between shoulder. Troubling breathing from pressure of pain. Off/on chest pain. Reason for Disposition  [1] MILD difficulty breathing (e.g., minimal/no SOB at rest, SOB with walking, pulse < 100) AND [2] NEW-onset or WORSE than normal  Answer Assessment - Initial Assessment Questions Patient is calling to report she woke this am with pain between her shoulder blades and pressure with taking breath. Symptoms have been constant. Patient does have slight cold symptoms- but nothing that would cause this type of discomfort. Patient states she has had chest pain in the past days but none today. Appointment scheduled for evaluation of symptoms   1. RESPIRATORY STATUS: Describe your breathing? (e.g., wheezing, shortness of breath, unable to speak, severe coughing)      Pressure with breathing, pain between shoulder blades 2. ONSET: When did this breathing problem begin?      today 3. PATTERN Does the difficult breathing come and go, or has it been constant since it started?      constant 4. SEVERITY: How bad is your breathing? (e.g., mild, moderate, severe)      mild 5. RECURRENT SYMPTOM: Have you had difficulty breathing before? If Yes, ask: When was the last time? and What happened that time?      no 6. CARDIAC HISTORY: Do you have any history of heart disease? (e.g.,  heart attack, angina, bypass surgery, angioplasty)      PVC 7. LUNG HISTORY: Do you have any history of lung disease?  (e.g., pulmonary embolus, asthma, emphysema)     none 8. CAUSE: What do you think is causing the breathing problem?      unsure 9. OTHER SYMPTOMS: Do you have any other symptoms? (e.g., chest pain, cough, dizziness, fever, runny nose)     Runny nose, chest pain- several days ago, cough 10. O2 SATURATION MONITOR:  Do you use an oxygen saturation monitor (pulse oximeter) at home? If Yes, ask: What is your reading (oxygen level) today? What is your usual oxygen saturation reading? (e.g., 95%)       no  12. TRAVEL: Have you traveled out of the country in the last month? (e.g., travel history, exposures)       no  Protocols used: Breathing Difficulty-A-AH

## 2024-06-13 ENCOUNTER — Ambulatory Visit: Payer: PRIVATE HEALTH INSURANCE

## 2024-08-05 ENCOUNTER — Ambulatory Visit: Payer: PRIVATE HEALTH INSURANCE
